# Patient Record
Sex: Female | Born: 1975 | Race: White | Hispanic: No | Smoking: Current every day smoker
Health system: Southern US, Community
[De-identification: ages and names within clinical notes are randomized; demographics above are authoritative.]

## PROBLEM LIST (undated history)

## (undated) DIAGNOSIS — N809 Endometriosis, unspecified: Secondary | ICD-10-CM

## (undated) DIAGNOSIS — K589 Irritable bowel syndrome without diarrhea: Secondary | ICD-10-CM

## (undated) DIAGNOSIS — N898 Other specified noninflammatory disorders of vagina: Principal | ICD-10-CM

## (undated) DIAGNOSIS — F4310 Post-traumatic stress disorder, unspecified: Secondary | ICD-10-CM

## (undated) DIAGNOSIS — Z87442 Personal history of urinary calculi: Secondary | ICD-10-CM

## (undated) DIAGNOSIS — E559 Vitamin D deficiency, unspecified: Principal | ICD-10-CM

## (undated) DIAGNOSIS — F988 Other specified behavioral and emotional disorders with onset usually occurring in childhood and adolescence: Secondary | ICD-10-CM

## (undated) DIAGNOSIS — F419 Anxiety disorder, unspecified: Secondary | ICD-10-CM

## (undated) DIAGNOSIS — R519 Headache, unspecified: Secondary | ICD-10-CM

## (undated) DIAGNOSIS — M549 Dorsalgia, unspecified: Secondary | ICD-10-CM

## (undated) DIAGNOSIS — S3992XA Unspecified injury of lower back, initial encounter: Secondary | ICD-10-CM

## (undated) DIAGNOSIS — F32A Depression, unspecified: Secondary | ICD-10-CM

## (undated) DIAGNOSIS — K219 Gastro-esophageal reflux disease without esophagitis: Secondary | ICD-10-CM

## (undated) DIAGNOSIS — N289 Disorder of kidney and ureter, unspecified: Secondary | ICD-10-CM

## (undated) DIAGNOSIS — R569 Unspecified convulsions: Secondary | ICD-10-CM

## (undated) DIAGNOSIS — I10 Essential (primary) hypertension: Secondary | ICD-10-CM

## (undated) DIAGNOSIS — G479 Sleep disorder, unspecified: Secondary | ICD-10-CM

## (undated) DIAGNOSIS — G589 Mononeuropathy, unspecified: Secondary | ICD-10-CM

## (undated) DIAGNOSIS — R112 Nausea with vomiting, unspecified: Secondary | ICD-10-CM

## (undated) DIAGNOSIS — S199XXA Unspecified injury of neck, initial encounter: Secondary | ICD-10-CM

## (undated) DIAGNOSIS — Z9889 Other specified postprocedural states: Secondary | ICD-10-CM

## (undated) DIAGNOSIS — M25559 Pain in unspecified hip: Secondary | ICD-10-CM

## (undated) DIAGNOSIS — M1990 Unspecified osteoarthritis, unspecified site: Secondary | ICD-10-CM

## (undated) DIAGNOSIS — F329 Major depressive disorder, single episode, unspecified: Secondary | ICD-10-CM

## (undated) DIAGNOSIS — R319 Hematuria, unspecified: Secondary | ICD-10-CM

## (undated) DIAGNOSIS — R51 Headache: Secondary | ICD-10-CM

## (undated) DIAGNOSIS — Z79899 Other long term (current) drug therapy: Secondary | ICD-10-CM

## (undated) HISTORY — DX: Post-traumatic stress disorder, unspecified: F43.10

## (undated) HISTORY — PX: BACK SURGERY: SHX140

## (undated) HISTORY — DX: Personal history of urinary calculi: Z87.442

## (undated) HISTORY — DX: Depression, unspecified: F32.A

## (undated) HISTORY — DX: Sleep disorder, unspecified: G47.9

## (undated) HISTORY — PX: MOUTH SURGERY: SHX715

## (undated) HISTORY — DX: Mononeuropathy, unspecified: G58.9

## (undated) HISTORY — DX: Hematuria, unspecified: R31.9

## (undated) HISTORY — DX: Other specified noninflammatory disorders of vagina: N89.8

## (undated) HISTORY — DX: Unspecified injury of neck, initial encounter: S19.9XXA

## (undated) HISTORY — DX: Pain in unspecified hip: M25.559

## (undated) HISTORY — DX: Unspecified injury of lower back, initial encounter: S39.92XA

## (undated) HISTORY — DX: Headache, unspecified: R51.9

## (undated) HISTORY — PX: CYSTOSCOPY: SUR368

## (undated) HISTORY — DX: Anxiety disorder, unspecified: F41.9

## (undated) HISTORY — DX: Major depressive disorder, single episode, unspecified: F32.9

## (undated) HISTORY — PX: TONSILLECTOMY: SUR1361

## (undated) HISTORY — DX: Headache: R51

## (undated) HISTORY — PX: ABDOMINAL HYSTERECTOMY: SHX81

## (undated) HISTORY — DX: Endometriosis, unspecified: N80.9

## (undated) HISTORY — DX: Irritable bowel syndrome without diarrhea: K58.9

## (undated) HISTORY — PX: ENDOMETRIAL ABLATION: SHX621

## (undated) HISTORY — PX: CHOLECYSTECTOMY: SHX55

## (undated) HISTORY — DX: Other long term (current) drug therapy: Z79.899

## (undated) HISTORY — DX: Essential (primary) hypertension: I10

## (undated) HISTORY — DX: Dorsalgia, unspecified: M54.9

## (undated) HISTORY — DX: Other specified behavioral and emotional disorders with onset usually occurring in childhood and adolescence: F98.8

## (undated) HISTORY — DX: Vitamin D deficiency, unspecified: E55.9

## (undated) HISTORY — DX: Gastro-esophageal reflux disease without esophagitis: K21.9

## (undated) HISTORY — PX: TUBAL LIGATION: SHX77

---

## 2001-05-11 ENCOUNTER — Other Ambulatory Visit: Admission: RE | Admit: 2001-05-11 | Discharge: 2001-05-11 | Payer: Self-pay | Admitting: Obstetrics and Gynecology

## 2004-05-25 ENCOUNTER — Emergency Department (HOSPITAL_COMMUNITY): Admission: EM | Admit: 2004-05-25 | Discharge: 2004-05-25 | Payer: Self-pay | Admitting: Emergency Medicine

## 2004-06-01 ENCOUNTER — Ambulatory Visit (HOSPITAL_COMMUNITY): Admission: RE | Admit: 2004-06-01 | Discharge: 2004-06-01 | Payer: Self-pay | Admitting: Orthopaedic Surgery

## 2004-07-15 ENCOUNTER — Ambulatory Visit (HOSPITAL_COMMUNITY): Admission: RE | Admit: 2004-07-15 | Discharge: 2004-07-16 | Payer: Self-pay | Admitting: Neurosurgery

## 2004-08-17 ENCOUNTER — Encounter (HOSPITAL_COMMUNITY): Admission: RE | Admit: 2004-08-17 | Discharge: 2004-09-16 | Payer: Self-pay | Admitting: Neurosurgery

## 2005-10-27 ENCOUNTER — Ambulatory Visit (HOSPITAL_COMMUNITY): Admission: RE | Admit: 2005-10-27 | Discharge: 2005-10-27 | Payer: Self-pay | Admitting: Obstetrics and Gynecology

## 2005-10-28 ENCOUNTER — Ambulatory Visit (HOSPITAL_COMMUNITY): Admission: RE | Admit: 2005-10-28 | Discharge: 2005-10-28 | Payer: Self-pay | Admitting: General Surgery

## 2005-10-28 ENCOUNTER — Encounter (INDEPENDENT_AMBULATORY_CARE_PROVIDER_SITE_OTHER): Payer: Self-pay | Admitting: General Surgery

## 2006-03-09 ENCOUNTER — Ambulatory Visit (HOSPITAL_COMMUNITY): Admission: RE | Admit: 2006-03-09 | Discharge: 2006-03-09 | Payer: Self-pay | Admitting: Preventative Medicine

## 2006-03-21 ENCOUNTER — Encounter: Admission: RE | Admit: 2006-03-21 | Discharge: 2006-03-21 | Payer: Self-pay | Admitting: Preventative Medicine

## 2006-04-10 ENCOUNTER — Encounter: Admission: RE | Admit: 2006-04-10 | Discharge: 2006-04-10 | Payer: Self-pay | Admitting: Preventative Medicine

## 2007-05-25 ENCOUNTER — Ambulatory Visit (HOSPITAL_COMMUNITY): Admission: RE | Admit: 2007-05-25 | Discharge: 2007-05-25 | Payer: Self-pay | Admitting: Obstetrics & Gynecology

## 2007-10-23 ENCOUNTER — Other Ambulatory Visit: Admission: RE | Admit: 2007-10-23 | Discharge: 2007-10-23 | Payer: Self-pay | Admitting: Obstetrics and Gynecology

## 2007-11-01 ENCOUNTER — Ambulatory Visit (HOSPITAL_COMMUNITY): Admission: RE | Admit: 2007-11-01 | Discharge: 2007-11-01 | Payer: Self-pay | Admitting: Obstetrics and Gynecology

## 2008-08-26 ENCOUNTER — Emergency Department (HOSPITAL_COMMUNITY): Admission: EM | Admit: 2008-08-26 | Discharge: 2008-08-26 | Payer: Self-pay | Admitting: Emergency Medicine

## 2008-10-20 ENCOUNTER — Emergency Department (HOSPITAL_COMMUNITY): Admission: EM | Admit: 2008-10-20 | Discharge: 2008-10-20 | Payer: Self-pay | Admitting: Emergency Medicine

## 2008-11-07 ENCOUNTER — Ambulatory Visit (HOSPITAL_COMMUNITY): Admission: RE | Admit: 2008-11-07 | Discharge: 2008-11-07 | Payer: Self-pay | Admitting: Family Medicine

## 2009-03-25 ENCOUNTER — Other Ambulatory Visit: Admission: RE | Admit: 2009-03-25 | Discharge: 2009-03-25 | Payer: Self-pay | Admitting: Obstetrics and Gynecology

## 2009-06-23 ENCOUNTER — Emergency Department (HOSPITAL_COMMUNITY): Admission: EM | Admit: 2009-06-23 | Discharge: 2009-06-23 | Payer: Self-pay | Admitting: Emergency Medicine

## 2010-04-10 ENCOUNTER — Emergency Department (HOSPITAL_COMMUNITY): Admission: EM | Admit: 2010-04-10 | Discharge: 2010-04-10 | Payer: Self-pay | Admitting: Emergency Medicine

## 2010-11-14 ENCOUNTER — Encounter: Payer: Self-pay | Admitting: Preventative Medicine

## 2010-12-06 ENCOUNTER — Emergency Department (HOSPITAL_COMMUNITY)
Admission: EM | Admit: 2010-12-06 | Discharge: 2010-12-06 | Disposition: A | Discharge status: Home / Self Care | Payer: Medicaid Other | Attending: Emergency Medicine | Admitting: Emergency Medicine

## 2010-12-06 ENCOUNTER — Emergency Department (HOSPITAL_COMMUNITY): Payer: Medicaid Other

## 2010-12-06 DIAGNOSIS — R109 Unspecified abdominal pain: Secondary | ICD-10-CM | POA: Insufficient documentation

## 2010-12-06 DIAGNOSIS — N201 Calculus of ureter: Secondary | ICD-10-CM | POA: Insufficient documentation

## 2010-12-06 DIAGNOSIS — N390 Urinary tract infection, site not specified: Secondary | ICD-10-CM | POA: Insufficient documentation

## 2010-12-06 LAB — URINALYSIS, ROUTINE W REFLEX MICROSCOPIC
Bilirubin Urine: NEGATIVE
Ketones, ur: NEGATIVE mg/dL
Leukocytes, UA: NEGATIVE
Nitrite: POSITIVE — AB
Protein, ur: NEGATIVE mg/dL
Specific Gravity, Urine: 1.025 (ref 1.005–1.030)
Urine Glucose, Fasting: NEGATIVE mg/dL
Urobilinogen, UA: 0.2 mg/dL (ref 0.0–1.0)
pH: 6.5 (ref 5.0–8.0)

## 2010-12-06 LAB — COMPREHENSIVE METABOLIC PANEL WITH GFR
ALT: 11 U/L (ref 0–35)
AST: 19 U/L (ref 0–37)
BUN: 7 mg/dL (ref 6–23)
Calcium: 8.9 mg/dL (ref 8.4–10.5)
GFR calc Af Amer: >60 mL/min (ref >60)
GFR calc non Af Amer: >60 mL/min (ref >60)
Total Bilirubin: 0.3 mg/dL (ref 0.3–1.2)
Total Protein: 6 g/dL (ref 6.0–8.3)

## 2010-12-06 LAB — DIFFERENTIAL
Basophils Absolute: 0 10*3/uL (ref 0.0–0.1)
Basophils Relative: 0 % (ref 0–1)
Eosinophils Absolute: 0.2 K/uL (ref 0.0–0.7)
Eosinophils Relative: 2 % (ref 0–5)
Lymphocytes Relative: 25 % (ref 12–46)
Lymphs Abs: 2.3 K/uL (ref 0.7–4.0)
Monocytes Absolute: 0.7 10*3/uL (ref 0.1–1.0)
Monocytes Relative: 8 % (ref 3–12)
Neutro Abs: 6 10*3/uL (ref 1.7–7.7)
Neutrophils Relative %: 65 % (ref 43–77)

## 2010-12-06 LAB — COMPREHENSIVE METABOLIC PANEL
Albumin: 3.5 g/dL (ref 3.5–5.2)
Alkaline Phosphatase: 47 U/L (ref 39–117)
CO2: 28 mEq/L (ref 19–32)
Chloride: 106 mEq/L (ref 96–112)
Creatinine, Ser: 0.89 mg/dL (ref 0.4–1.2)
Glucose, Bld: 114 mg/dL — ABNORMAL HIGH (ref 70–99)
Potassium: 4.7 mEq/L (ref 3.5–5.1)
Sodium: 138 mEq/L (ref 135–145)

## 2010-12-06 LAB — URINE MICROSCOPIC-ADD ON

## 2010-12-06 LAB — CBC
HCT: 39.4 % (ref 36.0–46.0)
Hemoglobin: 14 g/dL (ref 12.0–15.0)
MCH: 31.3 pg (ref 26.0–34.0)
MCHC: 35.5 g/dL (ref 30.0–36.0)
MCV: 88.1 fL (ref 78.0–100.0)
Platelets: 248 K/uL (ref 150–400)
RBC: 4.47 MIL/uL (ref 3.87–5.11)
RDW: 12.5 % (ref 11.5–15.5)
WBC: 9.2 K/uL (ref 4.0–10.5)

## 2010-12-06 LAB — PREGNANCY, URINE: Preg Test, Ur: NEGATIVE

## 2010-12-06 LAB — POCT PREGNANCY, URINE: Preg Test, Ur: NEGATIVE

## 2010-12-06 LAB — LIPASE, BLOOD: Lipase: 17 U/L (ref 11–59)

## 2010-12-06 MED ORDER — IOHEXOL 300 MG/ML  SOLN
100.0000 mL | Freq: Once | INTRAMUSCULAR | Status: AC | PRN
  Administered 2010-12-06: 100 mL via INTRAVENOUS

## 2010-12-08 LAB — URINE CULTURE
Colony Count: >=100000
Culture  Setup Time: 189905080022

## 2011-01-29 LAB — COMPREHENSIVE METABOLIC PANEL
AST: 18 U/L (ref 0–37)
Alkaline Phosphatase: 47 U/L (ref 39–117)
BUN: 7 mg/dL (ref 6–23)
CO2: 27 mEq/L (ref 19–32)
Chloride: 105 mEq/L (ref 96–112)
Creatinine, Ser: 0.89 mg/dL (ref 0.4–1.2)
GFR calc non Af Amer: >60 mL/min (ref >60)
Potassium: 4 mEq/L (ref 3.5–5.1)
Total Bilirubin: 0.5 mg/dL (ref 0.3–1.2)

## 2011-01-29 LAB — DIFFERENTIAL
Basophils Absolute: 0 10*3/uL (ref 0.0–0.1)
Basophils Relative: 0 % (ref 0–1)
Eosinophils Relative: 1 % (ref 0–5)
Lymphocytes Relative: 22 % (ref 12–46)

## 2011-01-29 LAB — D-DIMER, QUANTITATIVE: D-Dimer, Quant: 0.26 ug/mL-FEU (ref 0.00–0.48)

## 2011-01-29 LAB — CBC
HCT: 40.9 % (ref 36.0–46.0)
MCV: 94.2 fL (ref 78.0–100.0)
RBC: 4.34 MIL/uL (ref 3.87–5.11)
WBC: 9.5 10*3/uL (ref 4.0–10.5)

## 2011-01-29 LAB — POCT CARDIAC MARKERS: CKMB, poc: <1 ng/mL — ABNORMAL LOW (ref 1.0–8.0)

## 2011-03-08 NOTE — Op Note (Signed)
NAME:  Stacey Booth, Stacey Booth                ACCOUNT NO.:  [id number]   MEDICAL RECORD NO.:  [id number]          PATIENT TYPE:  AMB   LOCATION:  DAY                           FACILITY:  [facility]   PHYSICIAN:  Lazaro Arms, M.D.   DATE OF BIRTH:  08-08-1976   DATE OF PROCEDURE:  05/25/2007  DATE OF DISCHARGE:                               OPERATIVE REPORT   PREOPERATIVE DIAGNOSIS:  Multiparous female, desires permanent  sterilization.   POSTOPERATIVE DIAGNOSIS:  Multiparous female, desires permanent  sterilization.   PROCEDURE:  Laparoscopic bilateral tubal ligation.   SURGEON:  Lazaro Arms, M.D.   ANESTHESIA:  General endotracheal.   FINDINGS:  The patient had a normal uterus, tubes and ovaries.  No  evidence of an ectopic she had within the last 8 weeks.   DESCRIPTION OF OPERATION:  The patient was taken to the operating room  and placed in supine position, underwent general endotracheal  anesthesia.  She was placed in dorsal spine position, prepped and draped  in the usual sterile fashion.  A Hulka tenaculum was placed for uterine  manipulation.  An incision was made in the umbilicus.  I was able to put  the Veress needle in without difficulty.  However, her fascia was very  tough and I could not perforate her with the nonbladed trocar.  As a  result, I did an open laparoscopy without difficulty.  The camera was  then placed through the trocar without difficulty.  The pelvic anatomy  was identified and found to be normal.  Both tubes were identified,  burned to no resistance and beyond in the distal isthmic-ampullary  region of both tubes to no resistance and beyond.  There was good  hemostasis.  On both sides about a 3.5-cm segment was ablated.  The  instrument removed, gas allowed to escape, the fascia closed using 0  Vicryl running as a single suture.  A subcuticular closure was done and  Dermabond was placed on the top.  The patient tolerated the procedure  well.  She  experienced no blood loss and was taken to the recovery room  in good, stable condition.  All counts were correct x3.  She received  Ancef prophylactically.      Lazaro Arms, M.D.  Electronically Signed     LHE/MEDQ  D:  05/25/2007  T:  05/25/2007  Job:  [id number]

## 2011-03-08 NOTE — H&P (Signed)
NAME:  Stacey Booth, RYBOLT                ACCOUNT NO.:  [id number]   MEDICAL RECORD NO.:  [id number]          PATIENT TYPE:  AMB   LOCATION:  DAY                           FACILITY:  [facility]   PHYSICIAN:  Tilda Burrow, M.D. DATE OF BIRTH:  July 21, 1976   DATE OF ADMISSION:  11/01/2007  DATE OF DISCHARGE:  LH                              HISTORY & PHYSICAL   ADMITTING DIAGNOSIS:  Menorrhagia, dysmenorrhea, status post tubal  ligation.   HISTORY OF PRESENT ILLNESS:  This 35 year old female, multiparous,  status post tubal ligation in June 2008, is admitted for hysteroscopy,  D&C, and endometrial ablation.  She is gravida 5, para 2, AB 2, ectopic  1, who has been on birth control pills most of her life when she was not  pregnant.  Since tubal ligation performed in summer 2008, she has had  periods which were ridiculously heavy, with nausea and vomiting and  dizziness for the first 2 days of a 5-6 day cycle.  She describes 3 of  the days as unbearable.  We have discussed the option having to go back  on the pills, and the patient would prefer to attempt a surgical  intervention, having heard of endometrial ablation and not wishing to  resume oral contraceptives.  She is currently on Micronor as we suppress  the endometrium due to its heavy bleeding, with plans to withdrawal  Micronor shortly before her endometrial ablation.  She has had review of  the brochures and was given website sources, and has had a vaginal  ultrasound in our office performed which shows a small 8.7 x 4.3 x 4 cm  uterus, which is smooth, anteverted, with no evidence of fibroids.  The  endometrial cavity thickness is 0.6 cm at maximum thickness and is  anteflexed, anteverted.  Ovaries are small, visible, within normal  limits bilaterally.  There is no other adnexal pathology or tenderness.   PAST MEDICAL HISTORY:  Negative.   FAMILY HISTORY:  Negative.   DRUG ALLERGIES:  PENICILLIN.   She is not a candidate for the  Merina IUD due to the history of ectopic  pregnancy.   PHYSICAL EXAMINATION:  VITAL SIGNS:  Weight 173.6, blood pressure  118/70, pulse 80s.  Urinalysis negative.  Hemoglobin 12.6.  HEENT:  Pupils equal, round, reactive.  NECK:  Supple.  CHEST:  Clear to auscultation.  ABDOMEN:  Nontender.  PELVIC:  External genitalia normal.  Uterus anteflexed, nontender,  nonpurulent.  GC and chlamydia culture performed, as well as Pap smear.  Previous Pap in 2007 was ASCUS, without HPV present.   IMPRESSION:  Menorrhagia, dysmenorrhea.   PLAN:  Endometrial ablation November 01, 2007 at [facility].  Laboratory work, including TSH, on October 30, 2007.      Tilda Burrow, M.D.  Electronically Signed     JVF/MEDQ  D:  10/23/2007  T:  10/24/2007  Job:  [id number]

## 2011-03-08 NOTE — Op Note (Signed)
NAME:  Stacey Booth, Stacey Booth                ACCOUNT NO.:  [id number]   MEDICAL RECORD NO.:  [id number]          PATIENT TYPE:  AMB   LOCATION:  DAY                           FACILITY:  [facility]   PHYSICIAN:  Tilda Burrow, M.D. DATE OF BIRTH:  03/08/1976   DATE OF PROCEDURE:  DATE OF DISCHARGE:                               OPERATIVE REPORT   PREOPERATIVE DIAGNOSES:  1. Menorrhagia.  2. Dysmenorrhea.   POSTOPERATIVE DIAGNOSES:  1. Menorrhagia.  2. Dysmenorrhea.   PROCEDURE:  1. Hysteroscopy.  2. Endometrial ablation.   ASSESSMENT:  None.   ANESTHESIA:  General with LMA.   COMPLICATIONS:  None.   FINDINGS:  Uterus sounded to 7.5 cm, well denuded endometrial surfaces,  status post recent menses.   DESCRIPTION OF PROCEDURE:  The patient was taken to the operating room,  prepped and draped for vaginal procedure.  The speculum was inserted.  The cervix was grasped with a single tooth tenaculum.  A paracervical  block using 10 mL of Marcaine with epinephrine injected  circumferentially around the uterus.  The uterus was sounded to 7.5 cm  in the anteflexed position, dilated to 25 [place] followed introduction  of 30 degree hysteroscope, confirming a thoroughly denuded endometrial  cavity without polyps or masses or deformities.  There was no point in a  dilation and curettage, therefore, we proceeded with endometrial  ablation proceeding to test, insert and fill the  Gynecare ThermaChoice 3 endometrial ablation device with 9 mL of D5W  used to achieve 150 to 160 mmHg intra-uterine pressure.  The 8 minute  ablation sequence was completed successfully.  All the fluid retrieved  and accounted for and the patient then allowed to go to the recovery  room in stable condition, well sedated from her medications.      Tilda Burrow, M.D.  Electronically Signed     JVF/MEDQ  D:  11/01/2007  T:  11/01/2007  Job:  [id number]   cc:   [facility]

## 2011-03-11 NOTE — H&P (Signed)
NAME:  Stacey Booth, Stacey Booth                ACCOUNT NO.:  [id number]   MEDICAL RECORD NO.:  [id number]          PATIENT TYPE:  AMB   LOCATION:  DAY                           FACILITY:  [facility]   PHYSICIAN:  Dalia Heading, M.D.  DATE OF BIRTH:  [birth year]   DATE OF ADMISSION:  10/28/2005  DATE OF DISCHARGE:  LH                                HISTORY & PHYSICAL   CHIEF COMPLAINT:  Chronic cholecystitis.   HISTORY OF PRESENT ILLNESS:  Patient is a 35 year old white female who is  referred for evaluation and treatment of biliary colic secondary to chronic  cholecystitis.  She has been having right upper quadrant abdominal pain with  radiation to the right flank, nausea, and bloating for several weeks.  She  does have fatty food intolerance.  No fever, chills, or jaundice have been  noted.   PAST MEDICAL HISTORY:  Back difficulties.   PAST SURGICAL HISTORY:  Back surgery in 2005, tonsillectomy.   CURRENT MEDICATIONS:  Pepto-Bismol p.r.n.   ALLERGIES:  PENICILLIN.   REVIEW OF SYSTEMS:  Patient smokes a pack of cigarettes a day.  She drinks  alcohol only socially.  No other cardiopulmonary difficulties or bleeding  disorders are noted.   PHYSICAL EXAMINATION:  GENERAL:  On physical examination, patient is a well-  developed, well-nourished white female in no acute distress.  VITAL SIGNS:  She is afebrile and vital signs are stable.  HEENT:  No scleral icterus.  LUNGS:  Clear to auscultation with equal breath sounds bilaterally.  HEART:  Regular rate and rhythm without S3, S4, or murmurs.  ABDOMEN:  Soft and nontender.  She is tender in the right upper quadrant to  palpation.  No hepatosplenomegaly, masses, or hernias are identified.  Ultrasound of the gallbladder reveals cholelithiasis with a thickened  gallbladder wall.   IMPRESSION:  Cholecystitis, cholelithiasis.   PLAN:  The patient is scheduled for a laparoscopic cholecystectomy on  October 28, 2005.  The risks and benefits of the  procedure including  bleeding, infection, hepatobiliary injury, the possibility of an open  procedure were fully explained to the patient, who gave informed consent.      Dalia Heading, M.D.  Electronically Signed     MAJ/MEDQ  D:  10/27/2005  T:  10/27/2005  Job:  [id number]   cc:   [facility]  Fax: [phone]   Tilda Burrow, M.D.  Fax: [phone]

## 2011-03-11 NOTE — Op Note (Signed)
NAME:  Stacey Booth, Stacey Booth                ACCOUNT NO.:  [id number]   MEDICAL RECORD NO.:  [id number]          PATIENT TYPE:  AMB   LOCATION:  DAY                           FACILITY:  [facility]   PHYSICIAN:  Dalia Heading, M.D.  DATE OF BIRTH:  02-23-1976   DATE OF PROCEDURE:  10/28/2005  DATE OF DISCHARGE:                                 OPERATIVE REPORT   PREOPERATIVE DIAGNOSIS:  Cholecystitis, cholelithiasis.   POSTOPERATIVE DIAGNOSIS:  Cholecystitis, cholelithiasis.   PROCEDURE:  Laparoscopic cholecystectomy.   SURGEON:  Dr. Franky Macho.   ASSISTANT:  Dr. Arna Snipe.   ANESTHESIA:  General endotracheal.   INDICATIONS:  The patient is a 35 year old white female who presents with  biliary colic secondary to cholelithiasis. This was confirmed by ultrasound.  The common bile duct was within normal limits. The risks and benefits of the  procedure including bleeding, infection, hepatobiliary injury, and the  possibility of an open procedure were fully explained to the patient, who  gave informed consent.   PROCEDURE NOTE:  The patient was placed in the supine position. After  induction of general endotracheal anesthesia, the abdomen was prepped and  draped using the usual sterile technique with Betadine. Surgical site  confirmation was performed.   An infraumbilical incision was made down to the fascia. Veress needle was  introduced into the abdominal cavity, and confirmation of placement was done  using the saline drop test. The abdomen was then insufflated to 16 mmHg  pressure. An 11-mm trocar was introduced into the abdominal cavity under  direct visualization without difficulty. The patient was placed in reversed  Trendelenburg position. An additional 11-mm trocar was placed in the  epigastric region, and a 5-mm trocar was placed in the right upper quadrant  and right flank regions. Liver was inspected and noted to be within normal  limits. The gallbladder was retracted  superiorly and laterally. The  dissection was begun around the infundibulum of the gallbladder. Cystic duct  was first identified. Its juncture to the infundibulum fully identified.  Endoclips were placed proximally distally on the cystic duct, and the cystic  duct was divided. This was likewise done to the cystic artery. The  gallbladder was then freed away from the gallbladder fossa using Bovie  electrocautery. The gallbladder was delivered through the epigastric trocar  site using EndoCatch bag. The gallbladder fossa was inspected. No abnormal  bleeding or bile leakage was noted. Surgicel was placed in the gallbladder  fossa. All fluid and air then evacuate from the abdominal cavity prior to  removal of the trocars.   All wounds were irrigated with normal saline. All wounds were injected with  0.5% Sensorcaine. The infraumbilical fascia as well as epigastric fascia  were reapproximated using 0 Vicryl interrupted sutures. All skin incisions  were closed using staples. Betadine ointment and dry sterile dressings were  applied.   All tape and needle counts were correct at the end of the procedure. The  patient was extubated in the operating room and went back to recovery room  awake in stable condition.   COMPLICATIONS:  None.  SPECIMEN:  Gallbladder.   BLOOD LOSS:  Minimal.      Dalia Heading, M.D.  Electronically Signed     MAJ/MEDQ  D:  10/28/2005  T:  10/28/2005  Job:  [id number]

## 2011-03-11 NOTE — Op Note (Signed)
NAME:  Stacey Booth, Stacey Booth                ACCOUNT NO.:  [id number]   MEDICAL RECORD NO.:  [id number]          PATIENT TYPE:  OIB   LOCATION:  3013                         FACILITY:  [facility]   PHYSICIAN:  Reinaldo Meeker, M.D. DATE OF BIRTH:  12/19/1975   DATE OF PROCEDURE:  07/15/2004  DATE OF DISCHARGE:  07/16/2004                                 OPERATIVE REPORT   PREOPERATIVE DIAGNOSIS:  Herniated disk, L4-5 left.   POSTOPERATIVE DIAGNOSIS:  Herniated disk, L4-5 left.   OPERATION PERFORMED:  Left L4-5 interlaminar laminotomy for excision of  herniated disk with the operating microscope.   SECONDARY PROCEDURE:  Microdissection L4-5 disk and L5 nerve root.   SURGEON:  Reinaldo Meeker, M.D.   ASSISTANT:  Donalee Citrin, M.D.   ANESTHESIA:  General.   DESCRIPTION OF PROCEDURE:  After being placed in the prone position, the  patient's back was prepped and draped in the usual sterile fashion.  Localizing x-ray was taken prior to incision to identify the appropriate  level.  Midline incision was made above the spinous processes of L4 and L5.  Using Bovie cutting current, the incision was carried down to the spinous  processes.  Subperiosteal dissection was then carried out along the spinous  processes and lamina and self-retaining retractor was placed for exposure.  A second x-ray showed approach to the approach to the appropriate level.  Using high speed drill the inferior one third of the L4 lamina and the  medial one third of the facet joint were removed.  Drill was then used to  remove the superior third of the L5 lamina.  Residual bone and ligamentum  were removed in piecemeal fashion.  Microscope was draped and brought into  the field and used for the remainder of the case.  Using microdissection  technique, the lateral aspect of the thecal sac and L5 nerve root were  identified.  Further coagulation was carried down to the floor of the canal  to identify the L4-5 disk which was found  to be markedly herniated.  After  coagulating out the annulus, the annulus was incised with a 15 blade.  Using  pituitary rongeurs and curettes, a very thorough disk space cleanout was  carried out.  At the same time great care was taken to avoid injury to the  neural elements and this was successfully done.  Inspection inferior to the  disk space then showed an inferior fragment which was removed without  difficulty.  At this point inspection was carried out in all directions for  any evidence of residual compression and none could be identified.  Large  amounts of irrigation were carried out any bleeding controlled with  electrocoagulation and Gelfoam.  The wound was then closed using  interrupted Vicryl in the muscle, fascia, subcutaneous and subcuticular  tissues and staples on the skin.  A sterile dressing was applied and the  patient was extubated and taken to recovery room in stable condition.   At this point large amounts of irrigation were carried out and any bleeding  controlled with bipolar coagulation and Gelfoam.  The wound was then closed  using interrupted Vicryl in the muscle, fascia, subcutaneous and  subcuticular tissues and staples on the skin.  A sterile dressing was then  applied and the patient was extubated and taken to the recovery room in  stable condition.       ROK/MEDQ  D:  07/15/2004  T:  07/16/2004  Job:  [id number]

## 2011-03-23 ENCOUNTER — Encounter (HOSPITAL_COMMUNITY): Payer: Self-pay

## 2011-03-23 ENCOUNTER — Other Ambulatory Visit (HOSPITAL_COMMUNITY): Payer: Self-pay | Admitting: Urology

## 2011-03-23 ENCOUNTER — Ambulatory Visit (HOSPITAL_COMMUNITY)
Admission: RE | Admit: 2011-03-23 | Discharge: 2011-03-23 | Disposition: A | Discharge status: Home / Self Care | Payer: Medicaid Other | Source: Ambulatory Visit | Attending: Urology | Admitting: Urology

## 2011-03-23 DIAGNOSIS — R109 Unspecified abdominal pain: Secondary | ICD-10-CM

## 2011-03-23 DIAGNOSIS — R10A1 Flank pain, right side: Secondary | ICD-10-CM

## 2011-03-23 DIAGNOSIS — R1031 Right lower quadrant pain: Secondary | ICD-10-CM | POA: Insufficient documentation

## 2011-03-25 ENCOUNTER — Encounter (HOSPITAL_COMMUNITY): Payer: Medicaid Other

## 2011-03-25 ENCOUNTER — Other Ambulatory Visit: Payer: Self-pay | Admitting: Urology

## 2011-03-25 LAB — CBC
HCT: 40 % (ref 36.0–46.0)
Hemoglobin: 13.7 g/dL (ref 12.0–15.0)
MCH: 31.3 pg (ref 26.0–34.0)
MCHC: 34.3 g/dL (ref 30.0–36.0)
MCV: 91.3 fL (ref 78.0–100.0)
RBC: 4.38 MIL/uL (ref 3.87–5.11)

## 2011-03-25 LAB — BASIC METABOLIC PANEL
BUN: 6 mg/dL (ref 6–23)
CO2: 29 mEq/L (ref 19–32)
Calcium: 10 mg/dL (ref 8.4–10.5)
Chloride: 102 mEq/L (ref 96–112)
Creatinine, Ser: 0.84 mg/dL (ref 0.4–1.2)
GFR calc Af Amer: >60 mL/min (ref >60)
Glucose, Bld: 90 mg/dL (ref 70–99)

## 2011-03-28 ENCOUNTER — Ambulatory Visit (HOSPITAL_COMMUNITY): Payer: Medicaid Other

## 2011-03-28 ENCOUNTER — Ambulatory Visit (HOSPITAL_COMMUNITY)
Admission: RE | Admit: 2011-03-28 | Discharge: 2011-03-28 | Disposition: A | Discharge status: Home / Self Care | Payer: Medicaid Other | Source: Ambulatory Visit | Attending: Urology | Admitting: Urology

## 2011-03-28 DIAGNOSIS — N201 Calculus of ureter: Secondary | ICD-10-CM | POA: Insufficient documentation

## 2011-04-13 NOTE — H&P (Addendum)
NAME:  BRAXTYN, DORFF                ACCOUNT NO.:  [id number]  MEDICAL RECORD NO.:  [id number]           PATIENT TYPE:  O  LOCATION:  DOIB                          FACILITY:  [facility]  PHYSICIAN:  Dennie Maizes, M.D.   DATE OF BIRTH:  1975/12/29  DATE OF ADMISSION:  03/25/2011 DATE OF DISCHARGE:  LH                             HISTORY & PHYSICAL   She is coming to the [facility] on March 28, 2011.  CHIEF COMPLAINT:  Right flank pain, right distal ureteral calculus obstruction.  HISTORY OF PRESENT ILLNESS:  The is a 35 year old female experienced onset of severe right flank pain and dark-colored urine.  About 3 months ago, the patient was evaluated in the emergency room at [facility]  Urinalysis are not suggestive of urinary tract infection. Further evaluation was done with noncontrast CT scan of abdomen and pelvis.  This revealed a 3-mm size right distal ureteral calculus.  The patient was sent home with pain medication and antibiotics.  She felt better after this.  She has not passed a stone.  She has recurrent pain at present.  She denied having voiding difficulty, fever, chills or dysuria.  She was seen in the office.  X-ray of KUB revealed multiple calcific shadows in the region of distal ureter; one of them could represent the stone.  The patient was brought to the [facility] today for cystoscopy, retrograde pyelogram, right ureteroscopy, stone extraction and stent placement.  PAST MEDICAL HISTORY:  History of chronic bronchitis status post ectopic pregnancy status post bilateral tubal ligation, endometrial ablation status post cholecystectomy.  MEDICATIONS:  Percocet 5/325 mg p.o. one p.o. q.8 h. p.r.n. pain.  ALLERGIES:  PENICILLIN.  SOCIAL HISTORY:  The patient does not drink.  She is a smoker.  FAMILY HISTORY:  Positive for diabetes mellitus, heart disease, stroke, thyroid problems, and cancer of unknown site.  REVIEW OF  SYSTEMS:  Positive for low fever, chills, drug allergy to PENICILLIN, excessive thirst, hot and cold feeling, tiredness, sluggishness, abdominal pain, nausea, vomiting, back pain, frequent urination and painful urination.  PHYSICAL EXAMINATION:  GENERAL:  The patient is comfortable at the time of examination. VITAL SIGNS:  Temperature 97.5 degrees Fahrenheit, blood pressure 172/82, height 5 feet 6 inches, weight 155 pounds. HEAD, EYES, EARS, NOSE AND THROAT:  Normal. LUNGS:  Clear to auscultation. HEART:  Regular rate and rhythm.  No murmurs. ABDOMEN:  Soft.  No palpable flank mass.  Mild right costovertebral angle tenderness was noted.  Bladder not palpable.  IMPRESSION:  Right distal ureteral calculi with obstruction, right flank pain.  PLAN:  Cystoscopy, retrograde pyelogram, right ureteroscopy stone extraction and stent placement at [facility].  I have informed the patient regarding diagnosis, operative details, possible treatment outcome, possible risks and complications and she has agreed for the procedure to be done.     Dennie Maizes, M.D.   SK/MEDQ  D:  03/25/2011  T:  03/26/2011  Job:  [id number]  cc:   Melvyn Novas, MD Fax: [phone]  [facility]  Electronically Signed by Dennie Maizes  M.D. on 04/13/2011 10:42:05 AM

## 2011-04-13 NOTE — Op Note (Signed)
  Stacey Booth, Stacey Booth                ACCOUNT NO.:  [id number]  MEDICAL RECORD NO.:  [id number]  LOCATION:  DAYP                          FACILITY:  [facility]  PHYSICIAN:  Dennie Maizes, M.D.   DATE OF BIRTH:  31-Oct-1975  DATE OF PROCEDURE:  03/28/2011 DATE OF DISCHARGE:                              OPERATIVE REPORT   PREOPERATIVE DIAGNOSES:  Right distal ureteral calculus, right flank pain.  POSTOPERATIVE DIAGNOSES:  Right distal ureteral calculus, right flank pain.  OPERATIVE PROCEDURES:  Cystoscopy, retrograde pyelogram, and right ureteroscopy.  ANESTHESIA:  General.  SURGEON:  Dennie Maizes, MD  COMPLICATIONS:  None.  ESTIMATED BLOOD LOSS:  None.  DRAINS:  None.  SPECIMEN:  None.  INDICATIONS FOR PROCEDURE:  This 35 year old female has a history of urolithiasis in the past.  She was having severe intermittent right flank pain.  X-rays revealed a small right distal ureteral calculus. She had persistent pain, but she has not passed any stone in the urine. X-ray of the KUB area revealed multiple calcific densities in the pelvic area, probably representing the stone.  The patient was symptomatic, and thus she was taken to the operating room today for cystoscopy, retrograde pyelogram with possible ureteroscopy, stone extraction, and stent placement.  DESCRIPTION OF PROCEDURE:  General anesthesia was induced and the patient was placed on the OR table in the dorsal lithotomy position. Lower abdomen and genitalia were prepped and draped in a sterile fashion.  Cystoscopy was done with a 24-French scope.  Appearance of bladder was normal.  The trigone, ureteral orifice, and bladder mucosa were unremarkable.  A 5-French wedge catheter was placed in the right ureteral orifice.  About 7 mL of Omnipaque was injected into the right collecting system and retrograde pyelogram was done.  The distal urethra was normal.  All the calcific densities were found to be outside the line of  the ureter.  There is mild dilation of the upper pole collecting system.  A 5-French open-end catheter was then placed in the right ureteral orifice, a 0.03-gauge Bentson guide was then inserted in the right upper collecting system without any difficulty.  With the rigid ureteroscope, I examined the distal 5 cm of ureter and then no stone was noted.  The guidewire and instruments were removed.  It was possible the patient had passed a stone.  She was transferred to the PACU in a satisfactory condition.     Dennie Maizes, M.D.     SK/MEDQ  D:  03/28/2011  T:  03/29/2011  Job:  [id number]  cc:   Melvyn Novas, MD Fax: [phone]  Electronically Signed by Dennie Maizes M.D. on 04/13/2011 10:42:36 AM

## 2011-06-21 ENCOUNTER — Emergency Department (HOSPITAL_COMMUNITY)
Admission: EM | Admit: 2011-06-21 | Discharge: 2011-06-21 | Disposition: A | Discharge status: Home / Self Care | Payer: Medicaid Other | Attending: Emergency Medicine | Admitting: Emergency Medicine

## 2011-06-21 ENCOUNTER — Encounter (HOSPITAL_COMMUNITY): Payer: Self-pay

## 2011-06-21 DIAGNOSIS — I10 Essential (primary) hypertension: Secondary | ICD-10-CM | POA: Insufficient documentation

## 2011-06-21 DIAGNOSIS — L539 Erythematous condition, unspecified: Secondary | ICD-10-CM | POA: Insufficient documentation

## 2011-06-21 DIAGNOSIS — R609 Edema, unspecified: Secondary | ICD-10-CM | POA: Insufficient documentation

## 2011-06-21 DIAGNOSIS — T7840XA Allergy, unspecified, initial encounter: Secondary | ICD-10-CM | POA: Insufficient documentation

## 2011-06-21 MED ORDER — FAMOTIDINE IN NACL 20-0.9 MG/50ML-% IV SOLN
20.0000 mg | Freq: Once | INTRAVENOUS | Status: AC
  Administered 2011-06-21: 20 mg via INTRAVENOUS
  Filled 2011-06-21: qty 50

## 2011-06-21 MED ORDER — DIPHENHYDRAMINE HCL 50 MG/ML IJ SOLN
25.0000 mg | Freq: Once | INTRAMUSCULAR | Status: AC
  Administered 2011-06-21: 25 mg via INTRAVENOUS
  Filled 2011-06-21: qty 1

## 2011-06-21 MED ORDER — PREDNISONE 50 MG PO TABS: 50.0000 mg | ORAL_TABLET | Freq: Every day | ORAL | Status: AC

## 2011-06-21 MED ORDER — METHYLPREDNISOLONE SODIUM SUCC 125 MG IJ SOLR
125.0000 mg | Freq: Once | INTRAMUSCULAR | Status: AC
  Administered 2011-06-21: 125 mg via INTRAVENOUS
  Filled 2011-06-21: qty 2

## 2011-06-21 MED ORDER — FAMOTIDINE 20 MG PO TABS: 20.0000 mg | ORAL_TABLET | Freq: Two times a day (BID) | ORAL | Status: DC

## 2011-06-21 NOTE — ED Notes (Signed)
Awakened to reassess: Pt with decreased redness over body; hands remain bright red and swollen, but reports that itching and burning is decreased.  In no distress.

## 2011-06-21 NOTE — ED Notes (Signed)
Pt states she broke out in a rash yesterday. Hands swelling

## 2011-06-21 NOTE — ED Provider Notes (Signed)
History   Chart scribed for Donnetta Hutching, MD by Enos Fling; the patient was seen in room APA12/APA12; this patient's care was started at 1:35 PM.    CSN: [id number] Arrival date & time: 06/21/2011 11:38 AM  Chief Complaint  Patient presents with  . Rash   HPI Stacey Booth is a 35 y.o. female who presents to the Emergency Department complaining of allergic reaction. Pt reports waking up from hand swelling and redness 9 hours ago, pt noticed redness was present over entire torso, face, and upper legs with a few scattered welts as well as swelling also in face. Pt took benadryl and went back to sleep; she woke up a few hours later, vomited 7x, and felt her throat was tight. Swelling and redness has improved slightly since taking benadryl, welts have resolved. Pt denies sob, lip swelling, or tongue swelling. No cp or ha. Pt has significant h/o environmental and seasonal allergic reactions but unsure of cause of this reaction; possibly d/t insect bites (unfamiliar with the insect) to hands, upper extremities, and face yesterday while outside.    History reviewed. No pertinent past medical history.  History reviewed. No pertinent past surgical history.  No family history on file.  History  Substance Use Topics  . Smoking status: Current Everyday Smoker  . Smokeless tobacco: Not on file  . Alcohol Use: Yes    OB History    Grav Para Term Preterm Abortions TAB SAB Ect Mult Living                 Previous Medications   BISMUTH SUBSALICYLATE (PEPTO BISMOL) 262 MG CHEWABLE TABLET    Chew 524 mg by mouth as needed. vomiting    DIPHENHYDRAMINE (SOMINEX) 25 MG TABLET    Take 25 mg by mouth at bedtime as needed. For allergies    DIPHENHYDRAMINE-ACETAMINOPHEN (TYLENOL PM) 25-500 MG TABS    Take 1 tablet by mouth at bedtime as needed. For sleep    IBUPROFEN (ADVIL,MOTRIN) 200 MG TABLET    Take 200 mg by mouth every 6 (six) hours as needed. For pain      Allergies as of 06/21/2011 - Review  Complete 06/21/2011  Allergen Reaction Noted  . Penicillins  03/23/2011      Review of Systems 10 Systems reviewed and are negative for acute change except as noted in the HPI.  Physical Exam  BP 101/59  Pulse 94  Temp(Src) 97.4 F (36.3 C) (Oral)  Resp 20  Ht 5\' 6"  (1.676 m)  Wt 150 lb (68.04 kg)  BMI 24.21 kg/m2  SpO2 99%  LMP 06/18/2011  Physical Exam  Nursing note and vitals reviewed. Constitutional: She is oriented to person, place, and time. No distress.       Appearance consistent with age of record  HENT:  Head: Normocephalic and atraumatic.  Right Ear: External ear normal.  Left Ear: External ear normal.  Nose: Nose normal.  Mouth/Throat: Oropharynx is clear and moist.  Eyes: Conjunctivae are normal.  Neck: Neck supple.  Cardiovascular: Normal rate and regular rhythm.  Exam reveals no gallop and no friction rub.   No murmur heard. Pulmonary/Chest: Effort normal and breath sounds normal. She has no wheezes. She has no rhonchi. She has no rales. She exhibits no tenderness.       Airway intact  Abdominal: Soft. There is no tenderness.  Musculoskeletal: Normal range of motion.  Neurological: She is alert and oriented to person, place, and time. No sensory deficit.  Skin: No rash noted.       Diffuse erythema to body; edema to hands and face  Psychiatric: She has a normal mood and affect.    ED Course  Procedures  OTHER DATA REVIEWED: Nursing notes and vital signs reviewed.   ED COURSE: IV Benadryl, Pepcid, Solu-Medrol.  MDM: Recheck prior to discharge: Feeling much better, decreased pruritus, decreased rash.  IMPRESSION: No diagnosis found.   PLAN: discharge All results reviewed and discussed with pt, questions answered, pt agreeable with plan.   CONDITION ON DISCHARGE: stable   MEDS GIVEN IN ED: methylPREDNISolone sodium succinate (SOLU-MEDROL) injection 125 mg (125 mg Intravenous Given 06/21/11 1340)  famotidine (PEPCID) IVPB 20 mg (0 mg  Intravenous Stopped 06/21/11 1407)  diphenhydrAMINE (BENADRYL) injection 25 mg (25 mg Intravenous Given 06/21/11 1338)     DISCHARGE MEDICATIONS: New Prescriptions   No medications on file     SCRIBE ATTESTATION: I personally performed the services described in this documentation, which was scribed in my presence. The recorded information has been reviewed and considered. Donnetta Hutching, MD       Donnetta Hutching, MD 06/21/11 [phone]

## 2011-06-21 NOTE — ED Notes (Addendum)
C/o onset of diffuse red rash to extremities, hands, back, abd, chest, neck; c/o swelling to eyes and lips onset 0430 this AM; states she was bitten by "small black and white bugs that look like little mosquitoes" last night; also reports multiple episodes of n/v since onset; states took Benadryl 50mg  at 0500 w/o relief.

## 2011-07-14 LAB — COMPREHENSIVE METABOLIC PANEL
ALT: 14
BUN: 11
CO2: 26
Calcium: 9.9
GFR calc non Af Amer: >60
Glucose, Bld: 101 — ABNORMAL HIGH
Sodium: 140
Total Protein: 6.2

## 2011-07-14 LAB — RAPID URINE DRUG SCREEN, HOSP PERFORMED
Barbiturates: NOT DETECTED
Benzodiazepines: NOT DETECTED
Cocaine: NOT DETECTED
Opiates: NOT DETECTED

## 2011-07-14 LAB — HCG, QUANTITATIVE, PREGNANCY: hCG, Beta Chain, Quant, S: <2

## 2011-07-14 LAB — DRUG SCREEN PANEL (SERUM)
Barbiturate Scrn: NEGATIVE
Benzodiazepine Scrn: NEGATIVE
Cocaine (Metabolite): NEGATIVE
Methadone (Dolophine), Serum: NEGATIVE
Propoxyphene,Serum: NEGATIVE

## 2011-07-14 LAB — CBC
HCT: 42
Hemoglobin: 14.2
MCHC: 33.9
MCV: 92.5
RBC: 4.54
RDW: 13.2

## 2011-07-14 LAB — TSH: TSH: 1.319

## 2011-08-08 LAB — URINE MICROSCOPIC-ADD ON

## 2011-08-08 LAB — URINALYSIS, ROUTINE W REFLEX MICROSCOPIC
Nitrite: NEGATIVE
Specific Gravity, Urine: 1.025
Urobilinogen, UA: 0.2
pH: 6

## 2011-08-08 LAB — CBC
HCT: 41.3
MCHC: 34.3
MCV: 91.7
Platelets: 264
RDW: 13.4

## 2011-08-08 LAB — COMPREHENSIVE METABOLIC PANEL
Albumin: 3.9
BUN: 10
Calcium: 9.3
Chloride: 106
Creatinine, Ser: 0.8
GFR calc Af Amer: >60
Total Bilirubin: 0.7
Total Protein: 6.1

## 2011-08-08 LAB — DIFFERENTIAL
Basophils Absolute: 0
Lymphocytes Relative: 26
Lymphs Abs: 2.3
Monocytes Absolute: 0.6
Neutro Abs: 5.9

## 2011-08-08 LAB — HCG, QUANTITATIVE, PREGNANCY: hCG, Beta Chain, Quant, S: <2

## 2011-10-15 ENCOUNTER — Emergency Department (HOSPITAL_COMMUNITY)
Admission: EM | Admit: 2011-10-15 | Discharge: 2011-10-15 | Disposition: A | Discharge status: Home / Self Care | Payer: Medicaid Other | Attending: Emergency Medicine | Admitting: Emergency Medicine

## 2011-10-15 ENCOUNTER — Emergency Department (HOSPITAL_COMMUNITY): Payer: Medicaid Other

## 2011-10-15 ENCOUNTER — Encounter (HOSPITAL_COMMUNITY): Payer: Self-pay | Admitting: *Deleted

## 2011-10-15 DIAGNOSIS — R350 Frequency of micturition: Secondary | ICD-10-CM | POA: Insufficient documentation

## 2011-10-15 DIAGNOSIS — N949 Unspecified condition associated with female genital organs and menstrual cycle: Secondary | ICD-10-CM | POA: Insufficient documentation

## 2011-10-15 DIAGNOSIS — R3129 Other microscopic hematuria: Secondary | ICD-10-CM | POA: Insufficient documentation

## 2011-10-15 DIAGNOSIS — Z87442 Personal history of urinary calculi: Secondary | ICD-10-CM | POA: Insufficient documentation

## 2011-10-15 DIAGNOSIS — F17200 Nicotine dependence, unspecified, uncomplicated: Secondary | ICD-10-CM | POA: Insufficient documentation

## 2011-10-15 DIAGNOSIS — R102 Pelvic and perineal pain: Secondary | ICD-10-CM

## 2011-10-15 DIAGNOSIS — Z880 Allergy status to penicillin: Secondary | ICD-10-CM | POA: Insufficient documentation

## 2011-10-15 DIAGNOSIS — R109 Unspecified abdominal pain: Secondary | ICD-10-CM | POA: Insufficient documentation

## 2011-10-15 DIAGNOSIS — R110 Nausea: Secondary | ICD-10-CM | POA: Insufficient documentation

## 2011-10-15 HISTORY — DX: Disorder of kidney and ureter, unspecified: N28.9

## 2011-10-15 LAB — URINALYSIS, ROUTINE W REFLEX MICROSCOPIC
Bilirubin Urine: NEGATIVE
Protein, ur: NEGATIVE mg/dL
Urobilinogen, UA: 0.2 mg/dL (ref 0.0–1.0)

## 2011-10-15 LAB — WET PREP, GENITAL
Trich, Wet Prep: NONE SEEN
Yeast Wet Prep HPF POC: NONE SEEN

## 2011-10-15 MED ORDER — ONDANSETRON HCL 4 MG/2ML IJ SOLN
4.0000 mg | Freq: Once | INTRAMUSCULAR | Status: AC
  Administered 2011-10-15: 4 mg via INTRAVENOUS
  Filled 2011-10-15: qty 2

## 2011-10-15 MED ORDER — KETOROLAC TROMETHAMINE 30 MG/ML IJ SOLN
30.0000 mg | Freq: Once | INTRAMUSCULAR | Status: AC
  Administered 2011-10-15: 30 mg via INTRAVENOUS
  Filled 2011-10-15: qty 1

## 2011-10-15 MED ORDER — PROMETHAZINE HCL 25 MG PO TABS: 25.0000 mg | ORAL_TABLET | Freq: Four times a day (QID) | ORAL | Status: DC | PRN

## 2011-10-15 MED ORDER — METRONIDAZOLE 500 MG PO TABS: 500.0000 mg | ORAL_TABLET | Freq: Three times a day (TID) | ORAL | Status: AC

## 2011-10-15 MED ORDER — TRAMADOL-ACETAMINOPHEN 37.5-325 MG PO TABS: ORAL_TABLET | ORAL | Status: AC

## 2011-10-15 MED ORDER — AZITHROMYCIN 250 MG PO TABS
1000.0000 mg | ORAL_TABLET | Freq: Once | ORAL | Status: AC
  Administered 2011-10-15: 1000 mg via ORAL
  Filled 2011-10-15: qty 4

## 2011-10-15 MED ORDER — MORPHINE SULFATE 4 MG/ML IJ SOLN
4.0000 mg | Freq: Once | INTRAMUSCULAR | Status: AC
  Administered 2011-10-15: 4 mg via INTRAVENOUS
  Filled 2011-10-15: qty 1

## 2011-10-15 MED ORDER — DOXYCYCLINE HYCLATE 100 MG PO CAPS: 100.0000 mg | ORAL_CAPSULE | Freq: Two times a day (BID) | ORAL | Status: AC

## 2011-10-15 NOTE — ED Notes (Signed)
Pt c/o lower back pain this am then radiating to left flank this afternoon. States that it feels like someone is stabbing her left lower abdomen when she urinates. Pt also c/o nausea and possible fever this afternoon.

## 2011-10-15 NOTE — ED Provider Notes (Signed)
History     CSN: [id number]  Arrival date & time 10/15/11  1803   First MD Initiated Contact with Patient 10/15/11 1833      Chief Complaint  Patient presents with  . Flank Pain    (Consider location/radiation/quality/duration/timing/severity/associated sxs/prior treatment) HPI  Patient relates when she woke up today she had pain in the left side of her back that has gradually moved into her left side and now she has left lower quadrant stabbing pain. She's had nausea without vomiting she's unsure of fever she states she took Tylenol 2 hours ago and denies known fever or chills. She has frequency without dysuria but she states when she urinates it makes her abdominal pain feel worse. She states she was diagnosed of the kidneys done on the right in February and had a procedure in June because she had not passed the stone.  Patient relates she had uterine ablation done about 5 years ago because of heavy periods. She relates she just finished her period 2 days ago and it was heavier than usual and she was passing clots. She also relates she's separated from her husband because he was cheating on her. She denies any new sexual partners on her part.  PCP Dr. Delbert Harness Urologist Dr. Jerre Simon  Past Medical History  Diagnosis Date  . Renal disorder     Past Surgical History  Procedure Date  . Cystoscopy   . Back surgery   . Cholecystectomy   . Tubal ligation   . Tonsillectomy     History reviewed. No pertinent family history.  History  Substance Use Topics  . Smoking status: Current Everyday Smoker -- 1.0 packs/day    Types: Cigarettes  . Smokeless tobacco: Not on file  . Alcohol Use: Yes   employed  OB History    Grav Para Term Preterm Abortions TAB SAB Ect Mult Living                  Review of Systems  All other systems reviewed and are negative.    Allergies  Penicillins  Home Medications   Current Outpatient Rx  Name Route Sig Dispense Refill  . BISMUTH  SUBSALICYLATE 262 MG PO CHEW Oral Chew 524 mg by mouth as needed. vomiting     . DIPHENHYDRAMINE HCL (SLEEP) 25 MG PO TABS Oral Take 25 mg by mouth at bedtime as needed. For allergies     . DIPHENHYDRAMINE-APAP (SLEEP) 25-500 MG PO TABS Oral Take 1 tablet by mouth at bedtime as needed. For sleep     . IBUPROFEN 200 MG PO TABS Oral Take 200 mg by mouth every 6 (six) hours as needed. For pain       BP 145/84  Pulse 95  Temp(Src) 98.2 F (36.8 C) (Oral)  Resp 18  Ht 5\' 6"  (1.676 m)  Wt 150 lb (68.04 kg)  BMI 24.21 kg/m2  SpO2 100%  LMP 10/05/2011  Vital signs normal  Physical Exam  Nursing note and vitals reviewed. Constitutional: She is oriented to person, place, and time. She appears well-developed and well-nourished. She is cooperative.  Non-toxic appearance. She does not appear ill. No distress.  HENT:  Head: Normocephalic and atraumatic.  Right Ear: External ear normal.  Left Ear: External ear normal.  Nose: Nose normal. No mucosal edema or rhinorrhea.  Mouth/Throat: Oropharynx is clear and moist and mucous membranes are normal. No dental abscesses or uvula swelling.  Eyes: Conjunctivae and EOM are normal. Pupils are equal, round,  and reactive to light.  Neck: Normal range of motion and full passive range of motion without pain. Neck supple.  Cardiovascular: Normal rate, regular rhythm and normal heart sounds.  Exam reveals no gallop and no friction rub.   No murmur heard. Pulmonary/Chest: Effort normal and breath sounds normal. Not tachypneic. No respiratory distress. She has no wheezes. She has no rhonchi. She has no rales. She exhibits no tenderness and no crepitus.  Abdominal: Soft. Normal appearance and bowel sounds are normal. She exhibits no distension. There is tenderness. There is no rebound and no guarding.       Mild tenderness in the left lower quadrant  Genitourinary:       No flank pain  Tenderness to palpation Normal external genitalia, small amount of white  discharge, tender uterus which feels normal size, mildly tender ovaries bilaterally the left being slightly worse than the right without masses.  Musculoskeletal: She exhibits no edema and no tenderness.  Neurological: She is alert and oriented to person, place, and time. She has normal strength. No cranial nerve deficit.  Skin: Skin is warm, dry and intact. No rash noted. No erythema. No pallor.  Psychiatric: She has a normal mood and affect. Her speech is normal and behavior is normal. Thought content normal. Her mood appears not anxious.    ED Course  Procedures (including critical care time)  Patient given IV fluids, IV pain and nausea medicine. She states she's allergic to penicillin and amoxicillin, she does not know she's ever taken a cephalosporin before. She wanted to be treated for STDs after disclosing her husband has been cheating on her. She was given azithromycin  1 g orally.  Results for orders placed during the hospital encounter of 10/15/11  URINALYSIS, ROUTINE W REFLEX MICROSCOPIC      Component Value Range   Color, Urine YELLOW  YELLOW    APPearance CLEAR  CLEAR    Specific Gravity, Urine 1.010  1.005 - 1.030    pH 6.0  5.0 - 8.0    Glucose, UA NEGATIVE  NEGATIVE (mg/dL)   Hgb urine dipstick SMALL (*) NEGATIVE    Bilirubin Urine NEGATIVE  NEGATIVE    Ketones, ur NEGATIVE  NEGATIVE (mg/dL)   Protein, ur NEGATIVE  NEGATIVE (mg/dL)   Urobilinogen, UA 0.2  0.0 - 1.0 (mg/dL)   Nitrite NEGATIVE  NEGATIVE    Leukocytes, UA NEGATIVE  NEGATIVE   URINE MICROSCOPIC-ADD ON      Component Value Range   Squamous Epithelial / LPF FEW (*) RARE    RBC / HPF 3-6  <3 (RBC/hpf)   Bacteria, UA RARE  RARE   PREGNANCY, URINE      Component Value Range   Preg Test, Ur NEGATIVE    RPR      Component Value Range   RPR NON REACTIVE  NON REACTIVE   WET PREP, GENITAL      Component Value Range   Yeast, Wet Prep NONE SEEN  NONE SEEN    Trich, Wet Prep NONE SEEN  NONE SEEN    Clue  Cells, Wet Prep NONE SEEN  NONE SEEN    WBC, Wet Prep HPF POC FEW (*) NONE SEEN     Laboratory interpretation all normal except for microscopic hematuria  Ct Abdomen Pelvis Wo Contrast  10/15/2011  *RADIOLOGY REPORT*  Clinical Data: Left flank pain, prior history of stones with extraction, question left ureteral calculus  CT ABDOMEN AND PELVIS WITHOUT CONTRAST  Technique:  Multidetector CT  imaging of the abdomen and pelvis was performed following the standard protocol without intravenous contrast. Sagittal and coronal MPR images reconstructed from axial data set.  Comparison: 12/06/2010  Findings: Lung bases clear. Gallbladder surgically absent. No urinary tract calcification or dilatation. Bladder well distended and unremarkable. Within limits of a nonenhanced exam, no focal abnormalities of the liver, spleen, pancreas, kidneys, or adrenal glands identified. Stomach and bowel loops unremarkable for technique.  Appendix not visualized. Normal appearing uterus and adnexae. Numerous pelvic phleboliths. No mass, adenopathy, free fluid or inflammatory process. Advanced degenerative disc disease changes L4-L5.  IMPRESSION: No acute intra abdominal or intrapelvic abnormalities. Specifically, no left side urinary tract calcification or dilatation identified.  Original Report Authenticated By: Lollie Marrow, M.D.       1. Female pelvic pain     New Prescriptions   DOXYCYCLINE (VIBRAMYCIN) 100 MG CAPSULE    Take 1 capsule (100 mg total) by mouth 2 (two) times daily.   METRONIDAZOLE (FLAGYL) 500 MG TABLET    Take 1 tablet (500 mg total) by mouth 3 (three) times daily.   PROMETHAZINE (PHENERGAN) 25 MG TABLET    Take 1 tablet (25 mg total) by mouth every 6 (six) hours as needed for nausea.   TRAMADOL-ACETAMINOPHEN (ULTRACET) 37.5-325 MG PER TABLET    2 tabs po QID prn pain      Plan discharge  Devoria Albe, MD, Armando Gang   MDM          Ward Givens, MD 10/16/11 2300

## 2011-10-16 LAB — RPR: RPR Ser Ql: NONREACTIVE

## 2011-10-17 LAB — GC/CHLAMYDIA PROBE AMP, GENITAL
Chlamydia, DNA Probe: NEGATIVE
GC Probe Amp, Genital: NEGATIVE

## 2011-11-15 ENCOUNTER — Other Ambulatory Visit: Payer: Self-pay | Admitting: Obstetrics & Gynecology

## 2011-11-24 ENCOUNTER — Encounter (HOSPITAL_COMMUNITY)
Admission: RE | Admit: 2011-11-24 | Discharge: 2011-11-24 | Disposition: A | Discharge status: Home / Self Care | Payer: Medicaid Other | Source: Ambulatory Visit | Attending: Obstetrics & Gynecology | Admitting: Obstetrics & Gynecology

## 2011-11-24 ENCOUNTER — Encounter (HOSPITAL_COMMUNITY): Payer: Self-pay

## 2011-11-24 ENCOUNTER — Encounter (HOSPITAL_COMMUNITY): Payer: Self-pay | Admitting: Pharmacy Technician

## 2011-11-24 HISTORY — DX: Unspecified osteoarthritis, unspecified site: M19.90

## 2011-11-24 HISTORY — DX: Other specified postprocedural states: Z98.890

## 2011-11-24 HISTORY — DX: Nausea with vomiting, unspecified: R11.2

## 2011-11-24 LAB — COMPREHENSIVE METABOLIC PANEL
ALT: 11 U/L (ref 0–35)
CO2: 22 mEq/L (ref 19–32)
Calcium: 10.2 mg/dL (ref 8.4–10.5)
Creatinine, Ser: 0.8 mg/dL (ref 0.50–1.10)
GFR calc Af Amer: >90 mL/min (ref >90)
GFR calc non Af Amer: >90 mL/min (ref >90)
Glucose, Bld: 70 mg/dL (ref 70–99)

## 2011-11-24 LAB — URINALYSIS, ROUTINE W REFLEX MICROSCOPIC
Bilirubin Urine: NEGATIVE
Hgb urine dipstick: NEGATIVE
Specific Gravity, Urine: 1.005 (ref 1.005–1.030)
Urobilinogen, UA: 0.2 mg/dL (ref 0.0–1.0)

## 2011-11-24 LAB — CBC
HCT: 43 % (ref 36.0–46.0)
Hemoglobin: 15 g/dL (ref 12.0–15.0)
MCH: 32 pg (ref 26.0–34.0)
MCV: 91.7 fL (ref 78.0–100.0)
RBC: 4.69 MIL/uL (ref 3.87–5.11)

## 2011-11-24 LAB — SURGICAL PCR SCREEN: MRSA, PCR: NEGATIVE

## 2011-11-24 NOTE — Patient Instructions (Addendum)
20 Stacey Booth  11/24/2011   Your procedure is scheduled on:  11/30/11  Report to [facility] at 06:15 AM.  Call this number if you have problems the morning of surgery: [phone]   Remember:   Do not eat food:After Midnight.  May have clear liquids:until Midnight .  Clear liquids include soda, tea, black coffee, apple or grape juice, broth.  Take these medicines the morning of surgery with A SIP OF WATER: none   Do not wear jewelry, make-up or nail polish.  Do not wear lotions, powders, or perfumes. You may wear deodorant.  Do not shave 48 hours prior to surgery.  Do not bring valuables to the hospital.  Contacts, dentures or bridgework may not be worn into surgery.  Leave suitcase in the car. After surgery it may be brought to your room.  For patients admitted to the hospital, checkout time is 11:00 AM the day of discharge.   Patients discharged the day of surgery will not be allowed to drive home.  Name and phone number of your driver:   Special Instructions: CHG Shower Use Special Wash: 1/2 bottle night before surgery and 1/2 bottle morning of surgery.   Please read over the following fact sheets that you were given: Pain Booklet, MRSA Information, Surgical Site Infection Prevention, Anesthesia Post-op Instructions and Care and Recovery After Surgery    Hysterectomy A hysterectomy is a procedure where your womb (uterus) is surgically taken out. It will no longer be possible to have menstrual periods or to become pregnant. Removal of the tubes and ovaries (bilateral salpingo-oopherectomy) can be done during this operation as well.   An abdominal hysterectomy is done through a large cut (incision) in the abdomen made by the surgeon.   A vaginal hysterectomy is done through the vagina. There are no abdominal incisions, but there will be incisions on the inside of the vagina.   A laparoscopic assisted vaginal hysterectomy is done through 2 or 3 small incisions in the abdomen, but  the uterus is removed and passed through the vagina.  Women who are going to have a hysterectomy should be tested first to make sure there is no cancer of the cervix or in the uterus. INDICATIONS FOR HYSTERECTOMY:  Persistent abnormal bleeding.   Lasting (chronic) pelvic pain.   Endometriosis. This is when the lining of the uterus (endometrium) is misplaced outside of its normal location.   Adenomyosis. This is when the endometrium tissue grows in the muscle of the uterus.   Uterine prolapse. This is when the uterus falls down into the vagina.   Cancer of the uterus or cervix that requires a radical hysterectomy, removal of the uterus, tubes, ovaries, and surrounding lymph nodes.  LET YOUR CAREGIVER KNOW ABOUT:  Allergies (especially to medicines).   Medications taken including herbs, eye drops, over the counter medications, and creams.   Use of steroids (by mouth or creams).   Past problems with anesthetics or numbing medication.   Possibility of pregnancy, if this applies.   History of blood clots (thrombophlebitis).   History of bleeding or blood problems.   Past surgery.   Other health problems.  RISKS AND COMPLICATIONS All surgeries can have risks. Some of these risks are:  A lot of bleeding.   Injury to surrounding organs.   Infection.   Blood clots of the leg, heart, or lung.   Problems with anesthesia.   Early menopause.  BEFORE THE PROCEDURE  Do not take aspirin or blood  thinners for a week before surgery, or as directed by your caregiver.   Do not eat or drink anything after midnight the night before surgery, or as directed by your caregiver.   Let your caregiver know if you get a cold or other infectious problems before surgery.   If you are being admitted the day of surgery, you should be present 60 minutes before your procedure or as told by your caregiver.  PROCEDURE   An IV (intravenous) will be placed in your arm. You will be given a drug to  make you sleep (anesthetic) during surgery. You may be given a shot in the spine (spinal anesthesia) that will numb your body from the waist down. This will keep you pain-free during surgery.   When you wake from surgery, you will have the IV and a long, narrow, hollow tube (urinary catheter) draining the bladder for 1 or 2 days after surgery. This will make passing your urine easier. It also helps by keeping your bladder empty during surgery.   After surgery, you will be taken to the recovery area where a nurse will watch and check your progress. Once you wake up, stable and taking fluids well, without other problems, you will be allowed to return to your room. Usually you will remain in the hospital 3 to 5 days. You may be given an antibiotic during and after the surgery and when you go home. Pain medication will be ordered by your caregiver while you are in the hospital and when you go home.  HOME CARE INSTRUCTIONS  Healing will take time. You will have discomfort, tenderness, swelling, and bruising at the operative site for a couple of weeks. This is normal and will get better as time goes on.   Only take over-the-counter or prescription medicines for pain, discomfort, or fever as directed by your caregiver.   Do not take aspirin. It can cause bleeding.   Do not drive when taking pain medication.   Follow your caregiver's advice regarding diet, exercise, lifting, driving, and general activities.   Resume your usual diet as directed and allowed.   Get plenty of rest and sleep.   Do not douche, use tampons, or have sexual intercourse until your caregiver gives you permission.   Change your bandages (dressings) as directed.   Take your temperature twice a day. Write it down.   Your caregiver may recommend showers instead of baths for a few weeks.   Do not drink alcohol until your caregiver gives you permission.   If you develop constipation, you may take a mild laxative with your  caregiver's permission. Bran foods and drinking fluids helps with constipation problems.   Try to have someone home with you for a week or two to help with the household activities.   Make sure you and your family understands everything about your operation and recovery.   Do not sign any legal documents until you feel normal again.   Keep all your follow-up appointments as recommended by your caregiver.  SEEK MEDICAL CARE IF:   There is swelling, redness, or increasing pain in the wound area.   Pus is coming from the wound.   You notice a bad smell from the wound or surgical dressing.   You have pain, redness, and swelling from the intravenous site.   The wound is breaking open (the edges are not staying together).   You feel dizzy or feel like fainting.   You develop pain or bleeding when you  urinate.   You develop diarrhea.   You develop nausea and vomiting.   You develop abnormal vaginal discharge.   You develop a rash.   You have any type of abnormal reaction or develop an allergy to your medication.   You need stronger pain medication for your pain.  SEEK IMMEDIATE MEDICAL CARE IF:  You have a fever.   You develop abdominal pain.   You develop chest pain.   You develop shortness of breath.   You pass out.   You develop pain, swelling, or redness of your leg.   You develop heavy vaginal bleeding with or without blood clots.  Document Released: 04/05/2001 Document Revised: 06/22/2011 Document Reviewed: 02/21/2008 [facility] Patient Information 2012 [place], [place].

## 2011-11-24 NOTE — Pre-Procedure Instructions (Signed)
Pt viewed video 905. 

## 2011-11-26 LAB — TYPE AND SCREEN: Antibody Screen: NEGATIVE

## 2011-11-29 NOTE — H&P (Signed)
Stacey Booth is an 36 y.o. female. [id number] status ablation in 2009 who had initially good results but the heavy painful bleeding has returned.  Also she is having intermittent RLQ pain with pain with intercourse both on the right side and in the midline.  On exam she has very tender RLQ and with the uterus, which I think is adenomyosis.  As a result we will proceed with a TVH with RSO for definitive management.    Past Medical History  Diagnosis Date  . PONV (postoperative nausea and vomiting)   . Renal disorder     kidney stones  . Arthritis     Past Surgical History  Procedure Date  . Cystoscopy 5-6 months ago    [place]  . Cholecystectomy   . Tubal ligation   . Tonsillectomy   . Back surgery     ruptured disc-laparoscopic repair    Family History  Problem Relation Age of Onset  . Anesthesia problems Neg Hx   . Hypotension Neg Hx   . Malignant hyperthermia Neg Hx   . Pseudochol deficiency Neg Hx     Social History:  reports that she has been smoking Cigarettes.  She has a 3.75 pack-year smoking history. She does not have any smokeless tobacco history on file. She reports that she drinks alcohol. She reports that she does not use illicit drugs.  Allergies:  Allergies  Allergen Reactions  . Penicillins Hives      ROS  Review of Systems  Constitutional: Negative for fever, chills, weight loss, malaise/fatigue and diaphoresis.  HENT: Negative for hearing loss, ear pain, nosebleeds, congestion, sore throat, neck pain, tinnitus and ear discharge.   Eyes: Negative for blurred vision, double vision, photophobia, pain, discharge and redness.  Respiratory: Negative for cough, hemoptysis, sputum production, shortness of breath, wheezing and stridor.   Cardiovascular: Negative for chest pain, palpitations, orthopnea, claudication, leg swelling and PND.  Gastrointestinal: Positive for abdominal pain. Negative for heartburn, nausea, vomiting, diarrhea, constipation, blood in  stool and melena.  Gynecology: positive for heavy painful periods and dyspareunia in midline and in the right adnexa, patient does not have any LLQ pain at all Genitourinary: Negative for dysuria, urgency, frequency, hematuria and flank pain.  Musculoskeletal: Negative for myalgias, back pain, joint pain and falls.  Skin: Negative for itching and rash.  Neurological: Negative for dizziness, tingling, tremors, sensory change, speech change, focal weakness, seizures, loss of consciousness, weakness and headaches.  Endo/Heme/Allergies: Negative for environmental allergies and polydipsia. Does not bruise/bleed easily.  Psychiatric/Behavioral: Negative for depression, suicidal ideas, hallucinations, memory loss and substance abuse. The patient is not nervous/anxious and does not have insomnia.       Physical Exam Physical Exam  Vitals reviewed. Constitutional: She is oriented to person, place, and time. She appears well-developed and well-nourished.  HENT:  Head: Normocephalic and atraumatic.  Right Ear: External ear normal.  Left Ear: External ear normal.  Nose: Nose normal.  Mouth/Throat: Oropharynx is clear and moist.  Eyes: Conjunctivae and EOM are normal. Pupils are equal, round, and reactive to light. Right eye exhibits no discharge. Left eye exhibits no discharge. No scleral icterus.  Neck: Normal range of motion. Neck supple. No tracheal deviation present. No thyromegaly present.  Cardiovascular: Normal rate, regular rhythm, normal heart sounds and intact distal pulses.  Exam reveals no gallop and no friction rub.   No murmur heard. Respiratory: Effort normal and breath sounds normal. No respiratory distress. She has no wheezes. She has no rales.  She exhibits no tenderness.  GI: Soft. Bowel sounds are normal. She exhibits no distension and no mass. There is tenderness in the RLQ. There is no rebound and no guarding.  Genitourinary:       Vulva is normal without lesion Vagina is pink  moist without discharge Cervix normal in appearance and pap is normal Uterus is normal sized mobile and tender probably adenomyosis Adnexa is negative with normal sized ovaries by sonogram, the left ovary however is quite tender but mobile Musculoskeletal: Normal range of motion. She exhibits no edema and no tenderness.  Neurological: She is alert and oriented to person, place, and time. She has normal reflexes. She displays normal reflexes. No cranial nerve deficit. She exhibits normal muscle tone. Coordination normal.  Skin: Skin is warm and dry. No rash noted. No erythema. No pallor.  Psychiatric: She has a normal mood and affect. Her behavior is normal. Judgment and thought content normal.      Recent Results (from the past 336 hour(s))  URINALYSIS, ROUTINE W REFLEX MICROSCOPIC   Collection Time   11/24/11 10:26 AM      Component Value Range   Color, Urine YELLOW  YELLOW    APPearance CLEAR  CLEAR    Specific Gravity, Urine 1.005  1.005 - 1.030    pH 5.5  5.0 - 8.0    Glucose, UA NEGATIVE  NEGATIVE (mg/dL)   Hgb urine dipstick NEGATIVE  NEGATIVE    Bilirubin Urine NEGATIVE  NEGATIVE    Ketones, ur NEGATIVE  NEGATIVE (mg/dL)   Protein, ur NEGATIVE  NEGATIVE (mg/dL)   Urobilinogen, UA 0.2  0.0 - 1.0 (mg/dL)   Nitrite NEGATIVE  NEGATIVE    Leukocytes, UA NEGATIVE  NEGATIVE   SURGICAL PCR SCREEN   Collection Time   11/24/11 10:30 AM      Component Value Range   MRSA, PCR NEGATIVE  NEGATIVE    Staphylococcus aureus NEGATIVE  NEGATIVE   CBC   Collection Time   11/24/11 11:00 AM      Component Value Range   WBC 8.2  4.0 - 10.5 (K/uL)   RBC 4.69  3.87 - 5.11 (MIL/uL)   Hemoglobin 15.0  12.0 - 15.0 (g/dL)   HCT 16.1  09.6 - 04.5 (%)   MCV 91.7  78.0 - 100.0 (fL)   MCH 32.0  26.0 - 34.0 (pg)   MCHC 34.9  30.0 - 36.0 (g/dL)   RDW 40.9  81.1 - 91.4 (%)   Platelets 275  150 - 400 (K/uL)  COMPREHENSIVE METABOLIC PANEL   Collection Time   11/24/11 11:00 AM      Component Value  Range   Sodium 138  135 - 145 (mEq/L)   Potassium 4.4  3.5 - 5.1 (mEq/L)   Chloride 106  96 - 112 (mEq/L)   CO2 22  19 - 32 (mEq/L)   Glucose, Bld 70  70 - 99 (mg/dL)   BUN 9  6 - 23 (mg/dL)   Creatinine, Ser 7.82  0.50 - 1.10 (mg/dL)   Calcium 95.6  8.4 - 10.5 (mg/dL)   Total Protein 7.4  6.0 - 8.3 (g/dL)   Albumin 4.3  3.5 - 5.2 (g/dL)   AST 14  0 - 37 (U/L)   ALT 11  0 - 35 (U/L)   Alkaline Phosphatase 59  39 - 117 (U/L)   Total Bilirubin 0.2 (*) 0.3 - 1.2 (mg/dL)   GFR calc non Af Amer >90  >90 (mL/min)  GFR calc Af Amer >90  >90 (mL/min)  HCG, QUANTITATIVE, PREGNANCY   Collection Time   11/24/11 11:00 AM      Component Value Range   hCG, Beta Chain, Quant, S <1  <5 (mIU/mL)  TYPE AND SCREEN   Collection Time   11/24/11 11:00 AM      Component Value Range   ABO/RH(D) O POS     Antibody Screen NEG     Sample Expiration 12/08/2011       Assessment/Plan: 1.  Menometrorrhagia and dysmenorrhea status post ablation 2009 2.  RLQ pian and dyspareunia on the right  Will proceed with TVH and RSO.  Pt understands the risks of surgery including but not limited t  excessive bleeding requiring transfusion or reoperation, post-operative infection requiring prolonged hospitalization or re-hospitalization and antibiotic therapy, and damage to other organs including bladder, bowel, ureters and major vessels.  The patient also understands the alternative treatment options which were discussed in full.  All questions were answered.    Corbitt Cloke H 11/29/2011, 10:49 PM

## 2011-11-30 ENCOUNTER — Encounter (HOSPITAL_COMMUNITY): Payer: Self-pay | Admitting: *Deleted

## 2011-11-30 ENCOUNTER — Encounter (HOSPITAL_COMMUNITY)
Admission: RE | Disposition: A | Discharge status: Home / Self Care | Payer: Self-pay | Source: Ambulatory Visit | Attending: Obstetrics & Gynecology

## 2011-11-30 ENCOUNTER — Ambulatory Visit (HOSPITAL_COMMUNITY)
Admission: RE | Admit: 2011-11-30 | Discharge: 2011-11-30 | Disposition: A | Discharge status: Home / Self Care | Payer: Medicaid Other | Source: Ambulatory Visit | Attending: Obstetrics & Gynecology | Admitting: Obstetrics & Gynecology

## 2011-11-30 ENCOUNTER — Encounter (HOSPITAL_COMMUNITY): Payer: Self-pay | Admitting: Anesthesiology

## 2011-11-30 ENCOUNTER — Other Ambulatory Visit: Payer: Self-pay | Admitting: Obstetrics & Gynecology

## 2011-11-30 DIAGNOSIS — Z90710 Acquired absence of both cervix and uterus: Secondary | ICD-10-CM

## 2011-11-30 DIAGNOSIS — Z01812 Encounter for preprocedural laboratory examination: Secondary | ICD-10-CM | POA: Insufficient documentation

## 2011-11-30 DIAGNOSIS — N920 Excessive and frequent menstruation with regular cycle: Secondary | ICD-10-CM | POA: Insufficient documentation

## 2011-11-30 DIAGNOSIS — N946 Dysmenorrhea, unspecified: Secondary | ICD-10-CM | POA: Insufficient documentation

## 2011-11-30 DIAGNOSIS — R1031 Right lower quadrant pain: Secondary | ICD-10-CM | POA: Insufficient documentation

## 2011-11-30 HISTORY — PX: VAGINAL HYSTERECTOMY: SHX2639

## 2011-11-30 HISTORY — PX: SALPINGOOPHORECTOMY: SHX82

## 2011-11-30 SURGERY — HYSTERECTOMY, VAGINAL
Anesthesia: General | Site: Uterus | Laterality: Right | Wound class: Clean Contaminated

## 2011-11-30 MED ORDER — CIPROFLOXACIN IN D5W 400 MG/200ML IV SOLN: 400.0000 mg | INTRAVENOUS | Status: DC

## 2011-11-30 MED ORDER — KETOROLAC TROMETHAMINE 10 MG PO TABS: 10.0000 mg | ORAL_TABLET | Freq: Three times a day (TID) | ORAL | Status: AC | PRN

## 2011-11-30 MED ORDER — CLINDAMYCIN PHOSPHATE 900 MG/50ML IV SOLN
INTRAVENOUS | Status: AC
  Administered 2011-11-30: 900 mg via INTRAVENOUS
  Filled 2011-11-30: qty 50

## 2011-11-30 MED ORDER — DEXAMETHASONE SODIUM PHOSPHATE 4 MG/ML IJ SOLN
4.0000 mg | Freq: Once | INTRAMUSCULAR | Status: AC
  Administered 2011-11-30: 4 mg via INTRAVENOUS

## 2011-11-30 MED ORDER — FENTANYL CITRATE 0.05 MG/ML IJ SOLN
INTRAMUSCULAR | Status: DC | PRN
  Administered 2011-11-30 (×2): 50 ug via INTRAVENOUS
  Administered 2011-11-30: 100 ug via INTRAVENOUS
  Administered 2011-11-30 (×2): 50 ug via INTRAVENOUS

## 2011-11-30 MED ORDER — ONDANSETRON HCL 4 MG/2ML IJ SOLN: 4.0000 mg | Freq: Once | INTRAMUSCULAR | Status: DC | PRN

## 2011-11-30 MED ORDER — ROCURONIUM BROMIDE 100 MG/10ML IV SOLN
INTRAVENOUS | Status: DC | PRN
  Administered 2011-11-30: 5 mg via INTRAVENOUS
  Administered 2011-11-30: 35 mg via INTRAVENOUS

## 2011-11-30 MED ORDER — FENTANYL CITRATE 0.05 MG/ML IJ SOLN
INTRAMUSCULAR | Status: AC
  Filled 2011-11-30: qty 5

## 2011-11-30 MED ORDER — CLINDAMYCIN PHOSPHATE 900 MG/50ML IV SOLN
900.0000 mg | INTRAVENOUS | Status: AC
  Administered 2011-11-30: 900 mg via INTRAVENOUS

## 2011-11-30 MED ORDER — NEOSTIGMINE METHYLSULFATE 1 MG/ML IJ SOLN
INTRAMUSCULAR | Status: AC
  Filled 2011-11-30: qty 10

## 2011-11-30 MED ORDER — IBUPROFEN 800 MG PO TABS
800.0000 mg | ORAL_TABLET | Freq: Three times a day (TID) | ORAL | Status: DC | PRN
  Administered 2011-11-30: 800 mg via ORAL
  Filled 2011-11-30 (×2): qty 1

## 2011-11-30 MED ORDER — KETOROLAC TROMETHAMINE 30 MG/ML IJ SOLN
INTRAMUSCULAR | Status: DC | PRN
  Administered 2011-11-30: 30 mg via INTRAVENOUS

## 2011-11-30 MED ORDER — SODIUM CHLORIDE 0.9 % IV SOLN
8.0000 mg | Freq: Four times a day (QID) | INTRAVENOUS | Status: DC | PRN
  Filled 2011-11-30: qty 4

## 2011-11-30 MED ORDER — OXYCODONE-ACETAMINOPHEN 5-325 MG PO TABS: 1.0000 | ORAL_TABLET | ORAL | Status: AC | PRN

## 2011-11-30 MED ORDER — OXYCODONE-ACETAMINOPHEN 5-325 MG PO TABS
1.0000 | ORAL_TABLET | ORAL | Status: DC | PRN
  Administered 2011-11-30: 1 via ORAL
  Filled 2011-11-30: qty 1

## 2011-11-30 MED ORDER — GLYCOPYRROLATE 0.2 MG/ML IJ SOLN
INTRAMUSCULAR | Status: DC | PRN
  Administered 2011-11-30: .4 mg via INTRAVENOUS

## 2011-11-30 MED ORDER — CIPROFLOXACIN IN D5W 400 MG/200ML IV SOLN
INTRAVENOUS | Status: AC
  Administered 2011-11-30: 400 mg via INTRAVENOUS
  Filled 2011-11-30: qty 200

## 2011-11-30 MED ORDER — LACTATED RINGERS IV SOLN
INTRAVENOUS | Status: DC
Start: 2011-11-30 — End: 2011-11-30
  Administered 2011-11-30: 1000 mL via INTRAVENOUS

## 2011-11-30 MED ORDER — GLYCOPYRROLATE 0.2 MG/ML IJ SOLN
INTRAMUSCULAR | Status: AC
  Filled 2011-11-30: qty 2

## 2011-11-30 MED ORDER — SCOPOLAMINE 1 MG/3DAYS TD PT72
1.0000 | MEDICATED_PATCH | Freq: Once | TRANSDERMAL | Status: DC
  Administered 2011-11-30: 1.5 mg via TRANSDERMAL

## 2011-11-30 MED ORDER — SODIUM CHLORIDE 0.9 % IR SOLN
Status: DC | PRN
  Administered 2011-11-30: 1000 mL

## 2011-11-30 MED ORDER — MIDAZOLAM HCL 2 MG/2ML IJ SOLN
INTRAMUSCULAR | Status: AC
  Administered 2011-11-30: 2 mg via INTRAVENOUS
  Filled 2011-11-30: qty 2

## 2011-11-30 MED ORDER — LACTATED RINGERS IV SOLN
INTRAVENOUS | Status: DC | PRN
  Administered 2011-11-30 (×2): via INTRAVENOUS

## 2011-11-30 MED ORDER — ONDANSETRON HCL 4 MG/2ML IJ SOLN
INTRAMUSCULAR | Status: DC | PRN
  Administered 2011-11-30: 4 mg via INTRAVENOUS

## 2011-11-30 MED ORDER — PROPOFOL 10 MG/ML IV EMUL
INTRAVENOUS | Status: DC | PRN
  Administered 2011-11-30: 200 mg via INTRAVENOUS

## 2011-11-30 MED ORDER — FENTANYL CITRATE 0.05 MG/ML IJ SOLN
INTRAMUSCULAR | Status: AC
  Administered 2011-11-30: 50 ug via INTRAVENOUS
  Filled 2011-11-30: qty 2

## 2011-11-30 MED ORDER — KETOROLAC TROMETHAMINE 30 MG/ML IJ SOLN
INTRAMUSCULAR | Status: AC
  Filled 2011-11-30: qty 1

## 2011-11-30 MED ORDER — ONDANSETRON HCL 4 MG/2ML IJ SOLN
4.0000 mg | Freq: Once | INTRAMUSCULAR | Status: AC
  Administered 2011-11-30: 4 mg via INTRAVENOUS

## 2011-11-30 MED ORDER — ONDANSETRON HCL 8 MG PO TABS: 8.0000 mg | ORAL_TABLET | Freq: Four times a day (QID) | ORAL | Status: AC | PRN

## 2011-11-30 MED ORDER — NEOSTIGMINE METHYLSULFATE 1 MG/ML IJ SOLN
INTRAMUSCULAR | Status: DC | PRN
  Administered 2011-11-30: 3 mg via INTRAVENOUS

## 2011-11-30 MED ORDER — HYDROMORPHONE HCL PF 1 MG/ML IJ SOLN: 1.0000 mg | INTRAMUSCULAR | Status: DC | PRN

## 2011-11-30 MED ORDER — GLYCOPYRROLATE 0.2 MG/ML IJ SOLN
0.2000 mg | Freq: Once | INTRAMUSCULAR | Status: AC
  Administered 2011-11-30: 0.2 mg via INTRAVENOUS

## 2011-11-30 MED ORDER — BUPIVACAINE-EPINEPHRINE 0.5% -1:200000 IJ SOLN
INTRAMUSCULAR | Status: DC | PRN
  Administered 2011-11-30: 10 mL

## 2011-11-30 MED ORDER — FENTANYL CITRATE 0.05 MG/ML IJ SOLN
25.0000 ug | INTRAMUSCULAR | Status: DC | PRN
  Administered 2011-11-30 (×2): 50 ug via INTRAVENOUS

## 2011-11-30 MED ORDER — KCL IN DEXTROSE-NACL 20-5-0.45 MEQ/L-%-% IV SOLN
INTRAVENOUS | Status: DC
  Administered 2011-11-30: 11:00:00 via INTRAVENOUS

## 2011-11-30 MED ORDER — BUPIVACAINE-EPINEPHRINE PF 0.5-1:200000 % IJ SOLN
INTRAMUSCULAR | Status: AC
  Filled 2011-11-30: qty 10

## 2011-11-30 MED ORDER — ONDANSETRON HCL 4 MG PO TABS: 8.0000 mg | ORAL_TABLET | Freq: Four times a day (QID) | ORAL | Status: DC | PRN

## 2011-11-30 MED ORDER — MIDAZOLAM HCL 2 MG/2ML IJ SOLN
INTRAMUSCULAR | Status: AC
  Filled 2011-11-30: qty 2

## 2011-11-30 MED ORDER — PROPOFOL 10 MG/ML IV EMUL
INTRAVENOUS | Status: AC
  Filled 2011-11-30: qty 20

## 2011-11-30 MED ORDER — MIDAZOLAM HCL 2 MG/2ML IJ SOLN
1.0000 mg | INTRAMUSCULAR | Status: DC | PRN
  Administered 2011-11-30 (×2): 2 mg via INTRAVENOUS

## 2011-11-30 MED ORDER — ONDANSETRON HCL 4 MG/2ML IJ SOLN
INTRAMUSCULAR | Status: AC
  Filled 2011-11-30: qty 2

## 2011-11-30 MED ORDER — ZOLPIDEM TARTRATE 5 MG PO TABS: 5.0000 mg | ORAL_TABLET | Freq: Every evening | ORAL | Status: DC | PRN

## 2011-11-30 MED ORDER — CIPROFLOXACIN HCL 500 MG PO TABS: 500.0000 mg | ORAL_TABLET | Freq: Two times a day (BID) | ORAL | Status: AC

## 2011-11-30 SURGICAL SUPPLY — 61 items
ADH SKN CLS APL DERMABOND .7 (GAUZE/BANDAGES/DRESSINGS)
APPLIER CLIP 11 MED OPEN (CLIP)
APPLIER CLIP 13 LRG OPEN (CLIP) ×3
APR CLP LRG 13 20 CLIP (CLIP) ×2
APR CLP MED 11 20 MLT OPN (CLIP)
BAG HAMPER (MISCELLANEOUS) ×3 IMPLANT
BRR ADH 6X5 SEPRAFILM 1 SHT (MISCELLANEOUS)
CELLS DAT CNTRL 66122 CELL SVR (MISCELLANEOUS) IMPLANT
CLIP APPLIE 11 MED OPEN (CLIP) ×2 IMPLANT
CLIP APPLIE 13 LRG OPEN (CLIP) IMPLANT
CLOTH BEACON ORANGE TIMEOUT ST (SAFETY) ×3 IMPLANT
COVER LIGHT HANDLE STERIS (MISCELLANEOUS) ×6 IMPLANT
DECANTER SPIKE VIAL GLASS SM (MISCELLANEOUS) ×3 IMPLANT
DERMABOND ADVANCED (GAUZE/BANDAGES/DRESSINGS)
DERMABOND ADVANCED .7 DNX12 (GAUZE/BANDAGES/DRESSINGS) IMPLANT
DRAPE PROXIMA HALF (DRAPES) ×3 IMPLANT
DRAPE STERI URO 9X17 APER PCH (DRAPES) ×3 IMPLANT
DRAPE WARM FLUID 44X44 (DRAPE) ×3 IMPLANT
DRESSING TELFA 8X3 (GAUZE/BANDAGES/DRESSINGS) ×2 IMPLANT
ELECT REM PT RETURN 9FT ADLT (ELECTROSURGICAL) ×3
ELECTRODE REM PT RTRN 9FT ADLT (ELECTROSURGICAL) ×2 IMPLANT
FORMALIN 10 PREFIL 480ML (MISCELLANEOUS) ×3 IMPLANT
GAUZE PACKING 2X5 YD STERILE (GAUZE/BANDAGES/DRESSINGS) ×2 IMPLANT
GLOVE BIOGEL PI IND STRL 8 (GLOVE) ×2 IMPLANT
GLOVE BIOGEL PI INDICATOR 8 (GLOVE) ×1
GLOVE ECLIPSE 6.5 STRL STRAW (GLOVE) ×2 IMPLANT
GLOVE ECLIPSE 8.0 STRL XLNG CF (GLOVE) ×3 IMPLANT
GLOVE EXAM NITRILE MD LF STRL (GLOVE) ×1 IMPLANT
GLOVE INDICATOR 7.0 STRL GRN (GLOVE) ×2 IMPLANT
GOWN STRL REIN XL XLG (GOWN DISPOSABLE) ×9 IMPLANT
INST SET MAJOR GENERAL (KITS) ×3 IMPLANT
KIT ROOM TURNOVER AP CYSTO (KITS) ×3 IMPLANT
KIT ROOM TURNOVER APOR (KITS) ×3 IMPLANT
MANIFOLD NEPTUNE II (INSTRUMENTS) ×3 IMPLANT
NEEDLE HYPO 22GX1.5 SAFETY (NEEDLE) ×3 IMPLANT
NS IRRIG 1000ML POUR BTL (IV SOLUTION) ×6 IMPLANT
PACK ABDOMINAL MAJOR (CUSTOM PROCEDURE TRAY) ×3 IMPLANT
PACK PERI GYN (CUSTOM PROCEDURE TRAY) ×3 IMPLANT
PAD ARMBOARD 7.5X6 YLW CONV (MISCELLANEOUS) ×3 IMPLANT
RETRACTOR WND ALEXIS 18 MED (MISCELLANEOUS) IMPLANT
RETRACTOR WND ALEXIS 25 LRG (MISCELLANEOUS) IMPLANT
RTRCTR WOUND ALEXIS 18CM MED (MISCELLANEOUS)
RTRCTR WOUND ALEXIS 25CM LRG (MISCELLANEOUS)
SEPRAFILM MEMBRANE 5X6 (MISCELLANEOUS) ×2 IMPLANT
SET BASIN LINEN APH (SET/KITS/TRAYS/PACK) ×3 IMPLANT
SET IV ADMIN VERSALIGHT (MISCELLANEOUS) ×3 IMPLANT
STAPLER VISISTAT 35W (STAPLE) ×2 IMPLANT
SUT CHROMIC 0 CT 1 (SUTURE) ×2 IMPLANT
SUT MNCRL+ AB 3-0 CT1 36 (SUTURE) ×2 IMPLANT
SUT MON AB 3-0 SH 27 (SUTURE) ×1 IMPLANT
SUT MONOCRYL AB 3-0 CT1 36IN (SUTURE)
SUT VIC AB 0 CT1 27 (SUTURE) ×9
SUT VIC AB 0 CT1 27XBRD ANTBC (SUTURE) ×2 IMPLANT
SUT VIC AB 0 CT1 27XCR 8 STRN (SUTURE) ×4 IMPLANT
SUT VIC AB 0 CT2 8-18 (SUTURE) ×2 IMPLANT
SUT VIC AB 0 CTX 36 (SUTURE)
SUT VIC AB 0 CTX36XBRD ANTBCTR (SUTURE) ×2 IMPLANT
SUT VICRYL 3 0 (SUTURE) IMPLANT
SYR CONTROL 10ML LL (SYRINGE) ×3 IMPLANT
TRAY FOLEY CATH 14FR (SET/KITS/TRAYS/PACK) ×3 IMPLANT
VERSALIGHT (MISCELLANEOUS) ×3 IMPLANT

## 2011-11-30 NOTE — Progress Notes (Signed)
Discharge instructions given to patient with no questions. Patient left facility in wheelchair with family.

## 2011-11-30 NOTE — Op Note (Signed)
Preoperative diagnosis:  1.  Menometrorrhagia                                         2.  Dysmenorrhea                                         3.  RLQ pain                                         4.  Right sided and midline dyspareunia                                         5.  History of endometrial ablation in 2009  Postoperative diagnosis:  Same as above   Procedure:  Vaginal hysterectomy with removal of right tube and ovary  Surgeon:  Lazaro Arms MD  Anesthesia:  General Endotracheal  Findings:  The patient underwent and endometrial ablation in 2009 and initially had positive results.  However her heavy painful menstrual bleeding has returned over the past 2 years.  Additionally she has chronic right lower quadrant pain consistent with ovarian origin as well as midline and right-sided dyspareunia.  Her left adnexa and left lower quadrant are not involved with any of her pain and as a result we are planning to preserve her left ovary.  Preoperative ultrasound was unremarkable except for some small intramural myomas.  Intraoperative findings were normal appearing uterus possibly adenomyosis.  Both ovaries appeared to be normal as well.  There was no evidence of infection adhesions or endometriosis present.  Description of operation:  The patient was taken from the preoperative area to the operating room in stable condition. She was placed in the sitting position and underwent a general anesthetic. She was placed in the dorsal lithotomy position using candy cane stirrups. Patient was prepped and draped in the usual sterile fashion and a Foley catheter was placed.  A weighted speculum was placed and the cervix was grasped with thyroid tenaculums both anteriorly and posteriorly.  0.5% Marcaine with epinephrine was injected in a circumferential fashion about the cervix and the electrocautery unit was used to incise the vagina and push off cervix.  The posterior cul-de-sac was then entered sharply  without difficulty.  The uterosacral ligaments were clamped cut and transfixion suture ligated and held.  The cardinal ligaments were then clamped cut transfixion suture ligated and cut. The anterior peritoneum was identified the anterior cul-de-sac was entered sharply without difficulty. The anterior and posterior leaves of the broad ligament were plicated and the uterine vessels were clamped cut and suture ligated. Serial pedicles were taken of the fundus with each pedicle being clamped cut and suture ligated. The utero-ovarian ligaments were crossclamped the uterus was removed and both pedicles were transfixion suture ligated. There was good hemostasis of all the pedicles.  The right infundibulopelvic ligament on the right was crossclamped cut and transfixion suture ligated.  A large Hemoclip was placed above the suture to prevent the peritoneum slipping through the clamp when it was released.  The peritoneum was then closed in a pursestring  fashion using 3-0 Vicryl. The anterior posterior vagina was closed in interrupted fashion with good resultant hemostasis. After closure the lower pelvis and vagina were irrigated vigorously.  The sponge needle and instrument counts were correct x 3.  Total blood loss for the procedure was 50 cc.  The patient received 1 g of Ancef and 30 mg of Toradol IV preoperatively prophylactically.  She was taken to the recovery room in good stable condition awake alert doing well.  EURE,LUTHER H 11/30/2011, 9:20 AM

## 2011-11-30 NOTE — Interval H&P Note (Signed)
History and Physical Interval Note:  11/30/2011 7:31 AM  Stacey Booth  has presented today for surgery, with the diagnosis of dyspareunia menometrorrhagia dysmenorrhea  The various methods of treatment have been discussed with the patient and family. After consideration of risks, benefits and other options for treatment, the patient has consented to  Procedure(s): HYSTERECTOMY VAGINAL SALPINGO OOPHERECTOMY as a surgical intervention .  The patients' history has been reviewed, patient examined, no change in status, stable for surgery.  I have reviewed the patients' chart and labs.  Questions were answered to the patient's satisfaction.     EURE,LUTHER H

## 2011-11-30 NOTE — Transfer of Care (Signed)
Immediate Anesthesia Transfer of Care Note  Patient: Stacey Booth  Procedure(s) Performed:  HYSTERECTOMY VAGINAL - transvaginal hysterectomy with right salpingo-oophorectomy; SALPINGO OOPHERECTOMY  Patient Location: PACU  Anesthesia Type: General  Level of Consciousness: awake and alert   Airway & Oxygen Therapy: Patient Spontanous Breathing and Patient connected to face mask oxygen  Post-op Assessment: Report given to PACU RN and Post -op Vital signs reviewed and stable  Post vital signs: Reviewed and stable  Complications: No apparent anesthesia complications

## 2011-11-30 NOTE — Discharge Summary (Signed)
Physician Discharge Summary  Patient ID: JENAY MORICI MRN: [id number] DOB/AGE: 30-Dec-1975 36 y.o.  Admit date: 11/30/2011 Discharge date: 11/30/2011  Admission Diagnoses: Menometrorrhagia  Dysmenorrhea Dyspareunia RLQ pain Discharge Diagnoses:  Active Problems:  * No active hospital problems. *    Discharged Condition: good  Hospital Course: unremarkable  Consults: None  Significant Diagnostic Studies: none  Treatments: surgery: TVHRSO  Discharge Exam: Blood pressure 124/77, pulse 81, temperature 98.5 F (36.9 C), temperature source Oral, resp. rate 16, height 5\' 6"  (1.676 m), weight 149 lb 14.6 oz (68 kg), SpO2 99.00%. General appearance: alert, cooperative and no distress GI: soft, non-tender; bowel sounds normal; no masses,  no organomegaly  Disposition: Home or Self Care  Discharge Orders    Future Orders Please Complete By Expires   Diet - low sodium heart healthy      Increase activity slowly      Discharge instructions      Comments:   Nothing in vagina and no sex for 6 weeks   Call MD for:  temperature >100.4      Call MD for:  persistant nausea and vomiting      Call MD for:  severe uncontrolled pain        Medication List  As of 11/30/2011  3:35 PM   STOP taking these medications         ibuprofen 200 MG tablet      megestrol 40 MG tablet         TAKE these medications         Aspirin-Caffeine 400-32 MG Tabs   Take 2 tablets by mouth every 6 (six) hours as needed. For pain      ciprofloxacin 500 MG tablet   Commonly known as: CIPRO   Take 1 tablet (500 mg total) by mouth 2 (two) times daily.      ketorolac 10 MG tablet   Commonly known as: TORADOL   Take 1 tablet (10 mg total) by mouth every 8 (eight) hours as needed for pain.      ondansetron 8 MG tablet   Commonly known as: ZOFRAN   Take 1 tablet (8 mg total) by mouth every 6 (six) hours as needed for nausea.      oxyCODONE-acetaminophen 5-325 MG per tablet   Commonly known as:  PERCOCET   Take 1-2 tablets by mouth every 4 (four) hours as needed (moderate to severe pain (when tolerating fluids)).           Follow-up Information    Follow up in 1 week.         SignedLazaro Arms 11/30/2011, 3:35 PM

## 2011-11-30 NOTE — Anesthesia Preprocedure Evaluation (Signed)
Anesthesia Evaluation  Patient identified by MRN, date of birth, ID band Patient awake    Reviewed: Allergy & Precautions, H&P , NPO status , Patient's Chart, lab work & pertinent test results  History of Anesthesia Complications (+) PONV  Airway Mallampati: I      Dental  (+) Teeth Intact and Implants   Pulmonary Current Smoker,  clear to auscultation        Cardiovascular neg cardio ROS Regular Normal    Neuro/Psych    GI/Hepatic   Endo/Other    Renal/GU      Musculoskeletal  (+) Arthritis - (chronic LBP),   Abdominal   Peds  Hematology   Anesthesia Other Findings   Reproductive/Obstetrics                           Anesthesia Physical Anesthesia Plan  ASA: II  Anesthesia Plan: General   Post-op Pain Management:    Induction: Intravenous  Airway Management Planned: Oral ETT  Additional Equipment:   Intra-op Plan:   Post-operative Plan: Extubation in OR  Informed Consent: I have reviewed the patients History and Physical, chart, labs and discussed the procedure including the risks, benefits and alternatives for the proposed anesthesia with the patient or authorized representative who has indicated his/her understanding and acceptance.     Plan Discussed with:   Anesthesia Plan Comments:         Anesthesia Quick Evaluation

## 2011-11-30 NOTE — Preoperative (Signed)
Beta Blockers   Reason not to administer Beta Blockers:Not Applicable 

## 2011-12-01 NOTE — Anesthesia Postprocedure Evaluation (Signed)
  Anesthesia Post-op Note  Patient: Stacey Booth  Procedure(s) Performed:  HYSTERECTOMY VAGINAL - transvaginal hysterectomy with right salpingo-oophorectomy; SALPINGO OOPHERECTOMY  Patient Location: 3a  Anesthesia Type: General  Level of Consciousness: awake, alert , oriented and patient cooperative  Airway and Oxygen Therapy: Patient Spontanous Breathing  Post-op Pain: none  Post-op Assessment: Post-op Vital signs reviewed, Patient's Cardiovascular Status Stable, Respiratory Function Stable, Patent Airway, No signs of Nausea or vomiting, Adequate PO intake and Pain level controlled  Post-op Vital Signs: Reviewed and stable  Complications: No apparent anesthesia complications

## 2011-12-05 ENCOUNTER — Encounter (HOSPITAL_COMMUNITY): Payer: Self-pay | Admitting: Obstetrics & Gynecology

## 2012-03-10 ENCOUNTER — Emergency Department (HOSPITAL_COMMUNITY)
Admission: EM | Admit: 2012-03-10 | Discharge: 2012-03-10 | Disposition: A | Discharge status: Home / Self Care | Payer: Medicaid Other | Attending: Emergency Medicine | Admitting: Emergency Medicine

## 2012-03-10 ENCOUNTER — Emergency Department (HOSPITAL_COMMUNITY): Payer: Medicaid Other

## 2012-03-10 ENCOUNTER — Encounter (HOSPITAL_COMMUNITY): Payer: Self-pay | Admitting: Emergency Medicine

## 2012-03-10 DIAGNOSIS — R109 Unspecified abdominal pain: Secondary | ICD-10-CM | POA: Insufficient documentation

## 2012-03-10 DIAGNOSIS — R350 Frequency of micturition: Secondary | ICD-10-CM | POA: Insufficient documentation

## 2012-03-10 DIAGNOSIS — Z87442 Personal history of urinary calculi: Secondary | ICD-10-CM | POA: Insufficient documentation

## 2012-03-10 DIAGNOSIS — F17200 Nicotine dependence, unspecified, uncomplicated: Secondary | ICD-10-CM | POA: Insufficient documentation

## 2012-03-10 DIAGNOSIS — R110 Nausea: Secondary | ICD-10-CM | POA: Insufficient documentation

## 2012-03-10 DIAGNOSIS — R319 Hematuria, unspecified: Secondary | ICD-10-CM | POA: Insufficient documentation

## 2012-03-10 DIAGNOSIS — R3915 Urgency of urination: Secondary | ICD-10-CM | POA: Insufficient documentation

## 2012-03-10 LAB — BASIC METABOLIC PANEL
CO2: 26 mEq/L (ref 19–32)
Calcium: 9.6 mg/dL (ref 8.4–10.5)
GFR calc non Af Amer: 78 mL/min — ABNORMAL LOW (ref >90)
Potassium: 4.2 mEq/L (ref 3.5–5.1)
Sodium: 138 mEq/L (ref 135–145)

## 2012-03-10 LAB — URINALYSIS, ROUTINE W REFLEX MICROSCOPIC
Glucose, UA: NEGATIVE mg/dL
Leukocytes, UA: NEGATIVE
Protein, ur: NEGATIVE mg/dL
Specific Gravity, Urine: 1.015 (ref 1.005–1.030)
Urobilinogen, UA: 0.2 mg/dL (ref 0.0–1.0)

## 2012-03-10 MED ORDER — HYDROMORPHONE HCL PF 1 MG/ML IJ SOLN
1.0000 mg | Freq: Once | INTRAMUSCULAR | Status: AC
  Administered 2012-03-10: 1 mg via INTRAVENOUS
  Filled 2012-03-10: qty 1

## 2012-03-10 MED ORDER — ONDANSETRON HCL 4 MG/2ML IJ SOLN
4.0000 mg | Freq: Once | INTRAMUSCULAR | Status: AC
  Administered 2012-03-10: 4 mg via INTRAVENOUS
  Filled 2012-03-10: qty 2

## 2012-03-10 NOTE — ED Provider Notes (Signed)
Medical screening examination/treatment/procedure(s) were performed by non-physician practitioner and as supervising physician I was immediately available for consultation/collaboration.   Shelda Jakes, MD 03/10/12 [phone]

## 2012-03-10 NOTE — ED Provider Notes (Signed)
History     CSN: [id number]  Arrival date & time 03/10/12  0944   First MD Initiated Contact with Patient 03/10/12 [phone]      Chief Complaint  Patient presents with  . Flank Pain    (Consider location/radiation/quality/duration/timing/severity/associated sxs/prior treatment) HPI Comments: Stacey Booth presents for evaluation of a four-day history of right-sided flank pain which has been waxing and waning in character with increasingly dark urine and frequency of urination.  She does have a history of kidney stones.  She does have nausea, denies fevers or chills, no vomiting and no abdominal pain reported.  Pain is constant, she has tried Anacin alternating with ibuprofen with no relief of her symptoms.  She has increased her fluid intake, but without passage of suspected kidney stone.   Patient is a 36 y.o. female presenting with flank pain. The history is provided by the patient.  Flank Pain Associated symptoms include nausea. Pertinent negatives include no abdominal pain, arthralgias, chest pain, congestion, fever, headaches, joint swelling, neck pain, numbness, rash, sore throat, vomiting or weakness.    Past Medical History  Diagnosis Date  . PONV (postoperative nausea and vomiting)   . Renal disorder     kidney stones  . Arthritis     Past Surgical History  Procedure Date  . Cystoscopy 5-6 months ago    [place]  . Cholecystectomy   . Tubal ligation   . Tonsillectomy   . Back surgery     ruptured disc-laparoscopic repair  . Vaginal hysterectomy 11/30/2011    Procedure: HYSTERECTOMY VAGINAL;  Surgeon: Lazaro Arms, MD;  Location: [facility];  Service: Gynecology;  Laterality: N/A;  transvaginal hysterectomy with right salpingo-oophorectomy  . Salpingoophorectomy 11/30/2011    Procedure: SALPINGO OOPHERECTOMY;  Surgeon: Lazaro Arms, MD;  Location: [facility];  Service: Gynecology;  Laterality: Right;    Family History  Problem Relation Age of Onset  . Anesthesia problems Neg  Hx   . Hypotension Neg Hx   . Malignant hyperthermia Neg Hx   . Pseudochol deficiency Neg Hx     History  Substance Use Topics  . Smoking status: Current Everyday Smoker -- 0.2 packs/day for 15 years    Types: Cigarettes  . Smokeless tobacco: Not on file  . Alcohol Use: Yes     rarely    OB History    Grav Para Term Preterm Abortions TAB SAB Ect Mult Living                  Review of Systems  Constitutional: Negative for fever.  HENT: Negative for congestion, sore throat and neck pain.   Eyes: Negative.   Respiratory: Negative for chest tightness and shortness of breath.   Cardiovascular: Negative for chest pain.  Gastrointestinal: Positive for nausea. Negative for vomiting and abdominal pain.  Genitourinary: Positive for urgency, frequency, hematuria and flank pain.  Musculoskeletal: Negative for joint swelling and arthralgias.  Skin: Negative.  Negative for rash and wound.  Neurological: Negative for dizziness, weakness, light-headedness, numbness and headaches.  Hematological: Negative.   Psychiatric/Behavioral: Negative.     Allergies  Penicillins  Home Medications   Current Outpatient Rx  Name Route Sig Dispense Refill  . ASPIRIN-CAFFEINE 400-32 MG PO TABS Oral Take 2 tablets by mouth every 6 (six) hours as needed. For pain      BP 123/82  Pulse 104  Temp(Src) 98.1 F (36.7 C) (Oral)  Resp 20  Ht 5\' 7"  (1.702 m)  Wt 160  lb (72.576 kg)  BMI 25.06 kg/m2  SpO2 100%  LMP 11/02/2011  Physical Exam  Nursing note and vitals reviewed. Constitutional: She appears well-developed and well-nourished.  HENT:  Head: Normocephalic and atraumatic.  Eyes: Conjunctivae are normal.  Neck: Normal range of motion.  Cardiovascular: Normal rate, regular rhythm, normal heart sounds and intact distal pulses.   Pulmonary/Chest: Effort normal and breath sounds normal. She has no wheezes.  Abdominal: Soft. Bowel sounds are normal. There is no hepatosplenomegaly. There is no  tenderness. There is CVA tenderness. There is no rebound and no guarding.       CVA tenderness right.  Also reports pain in right upper quadrant which is not worsened with palpation.  Negative Murphy sign.  Musculoskeletal: Normal range of motion.  Neurological: She is alert.  Skin: Skin is warm and dry.  Psychiatric: She has a normal mood and affect.    ED Course  Procedures (including critical care time)   Labs Reviewed  URINALYSIS, ROUTINE W REFLEX MICROSCOPIC  BASIC METABOLIC PANEL   No results found.   No diagnosis found.  10:33 AM Patient given Dilaudid 1 mg and Zofran 4 mg IV.  With complete pain relief.   1:00 PM  discussed lab results with patient.  Also discussed recent CT scan from December which was a normal study.  Plan to repeat CT kidney stone protocol today after discussion with Dr. Gustavus Messing.  Patient agreeable.    MDM  CT scan dated 10/15/2011 reviewed with no evidence of urinary tract stone at that time.  Notified by RN that patient had left without notifying him or any other personnel.  She left prior to getting her abdominal pelvis kidney stone protocol CT scan.  I was unable to discuss potential risks of leaving AMA  with patient prior to her leaving, but she was stable and comfortable when last seen at 1 PM.      Burgess Amor, PA 03/10/12 1551  Burgess Amor, PA 03/10/12 1552

## 2012-03-10 NOTE — ED Notes (Signed)
Eustace Quail RN called pt and left message for pt to call back because was concerned pt left with IV in arm.  Called patient again but no answer.  Called [employer] and spoke with Holley Dexter and was told they would send a deputy to address listed to confirm IV out of arm.

## 2012-03-10 NOTE — ED Notes (Signed)
Pt c/o right sided flank pain x 4 days. Pt states she has hx of kidney stones and sx are similar.

## 2012-03-10 NOTE — ED Notes (Signed)
Pt c/o rt flank pain x 4 days with dark urine.

## 2012-03-10 NOTE — ED Notes (Signed)
Holley Dexter from c com called back and reports deputy verified pt had already taken the IV out.

## 2013-01-25 ENCOUNTER — Ambulatory Visit (INDEPENDENT_AMBULATORY_CARE_PROVIDER_SITE_OTHER): Payer: Medicaid Other | Admitting: Obstetrics & Gynecology

## 2013-01-25 ENCOUNTER — Encounter: Payer: Self-pay | Admitting: Obstetrics & Gynecology

## 2013-01-25 VITALS — BP 134/98 | Ht 66.0 in | Wt 167.0 lb

## 2013-01-25 DIAGNOSIS — R51 Headache: Secondary | ICD-10-CM

## 2013-01-25 DIAGNOSIS — N951 Menopausal and female climacteric states: Secondary | ICD-10-CM | POA: Insufficient documentation

## 2013-01-25 MED ORDER — ESTRADIOL 0.52 MG/0.87 GM (0.06%) TD GEL: 2.0000 "application " | Freq: Every evening | TRANSDERMAL | Status: DC

## 2013-01-25 MED ORDER — ZOLPIDEM TARTRATE 10 MG PO TABS: 10.0000 mg | ORAL_TABLET | Freq: Every evening | ORAL | Status: DC | PRN

## 2013-01-25 NOTE — Patient Instructions (Signed)

## 2013-01-25 NOTE — Progress Notes (Signed)
Patient ID: Stacey Booth, female   DOB: 03-May-1976, 37 y.o.   MRN: [id number] Subjective:    Stacey Booth is a 37 y.o. female who presents for complaint of hot flases for 2 weeks.  The patient is sexually active. GYN screening history: s/p hysterectomy. The patient is not taking hormone replacement therapy. Patient denies post-menopausal vaginal bleeding.. The patient wears seatbelts: yes. The patient participates in regular exercise: yes. Has the patient ever been transfused or tattooed?: yes. The patient reports that there is not domestic violence in her life.   Menstrual History: OB History   Grav Para Term Preterm Abortions TAB SAB Ect Mult Living                  Menarche age: 33  Patient's last menstrual period was 11/02/2011.    The following portions of the patient's history were reviewed and updated as appropriate: allergies, current medications, past family history, past medical history, past social history, past surgical history and problem list.  Review of Systems Pertinent items are noted in HPI.    Objective:    No exam performed today, not needed.    Assessment:    Hormone replacement therapy Menopause    Plan:    Elestrin 2 pumps a day and ambien for sleep   Face to face 15 minutes

## 2013-02-22 ENCOUNTER — Encounter: Payer: Self-pay | Admitting: Obstetrics & Gynecology

## 2013-02-22 ENCOUNTER — Ambulatory Visit (INDEPENDENT_AMBULATORY_CARE_PROVIDER_SITE_OTHER): Payer: Medicaid Other | Admitting: Obstetrics & Gynecology

## 2013-02-22 VITALS — BP 120/80 | Wt 170.0 lb

## 2013-02-22 DIAGNOSIS — F329 Major depressive disorder, single episode, unspecified: Secondary | ICD-10-CM

## 2013-02-22 DIAGNOSIS — N951 Menopausal and female climacteric states: Secondary | ICD-10-CM

## 2013-02-22 DIAGNOSIS — G4700 Insomnia, unspecified: Secondary | ICD-10-CM

## 2013-02-22 MED ORDER — ESCITALOPRAM OXALATE 10 MG PO TABS: 10.0000 mg | ORAL_TABLET | Freq: Every day | ORAL | Status: DC

## 2013-02-22 MED ORDER — ZOLPIDEM TARTRATE 10 MG PO TABS: 10.0000 mg | ORAL_TABLET | Freq: Every evening | ORAL | Status: DC | PRN

## 2013-02-22 NOTE — Patient Instructions (Signed)
Escitalopram tablets What is this medicine? ESCITALOPRAM (es sye TAL oh pram) is used to treat depression and certain types of anxiety. This medicine may be used for other purposes; ask your health care provider or pharmacist if you have questions. What should I tell my health care provider before I take this medicine? They need to know if you have any of these conditions: -bipolar disorder or a family history of bipolar disorder -diabetes -heart disease -kidney or liver disease -receiving electroconvulsive therapy -seizures (convulsions) -suicidal thoughts, plans, or attempt by you or a family member -an unusual or allergic reaction to escitalopram, the related drug citalopram, other medicines, foods, dyes, or preservatives -pregnant or trying to become pregnant -breast-feeding How should I use this medicine? Take this medicine by mouth with a glass of water. Follow the directions on the prescription label. You can take it with or without food. If it upsets your stomach, take it with food. Take your medicine at regular intervals. Do not take it more often than directed. Do not stop taking this medicine suddenly except upon the advice of your doctor. Stopping this medicine too quickly may cause serious side effects or your condition may worsen. A special MedGuide will be given to you by the pharmacist with each prescription and refill. Be sure to read this information carefully each time. Talk to your pediatrician regarding the use of this medicine in children. Special care may be needed. Overdosage: If you think you have taken too much of this medicine contact a poison control center or emergency room at once. NOTE: This medicine is only for you. Do not share this medicine with others. What if I miss a dose? If you miss a dose, take it as soon as you can. If it is almost time for your next dose, take only that dose. Do not take double or extra doses. What may interact with this medicine? Do  not take this medicine with any of the following medications: -cisapride -citalopram -linezolid -MAOIs like Carbex, Eldepryl, Marplan, Nardil, and Parnate -methylene blue (injected into a vein) -pimozide This medicine may also interact with the following medications: -alcohol -aspirin and aspirin-like medicines -carbamazepine -certain medicines for depression, anxiety, or psychotic disturbances -certain medicines for migraine headache like almotriptan, eletriptan, frovatriptan, naratriptan, rizatriptan, sumatriptan, zolmitriptan -cimetidine -diuretics -fentanyl -furazolidone -isoniazid -ketoconazole -lithium -medicines that treat or prevent blood clots like warfarin, enoxaparin, and dalteparin -medicines for sleep -metoprolol -NSAIDs, medicines for pain and inflammation, like ibuprofen or naproxen -procarbazine -rasagiline -supplements like St. John's wort, kava kava, valerian -tramadol -tryptophan This list may not describe all possible interactions. Give your health care provider a list of all the medicines, herbs, non-prescription drugs, or dietary supplements you use. Also tell them if you smoke, drink alcohol, or use illegal drugs. Some items may interact with your medicine. What should I watch for while using this medicine? Tell your doctor if your symptoms do not get better or if they get worse. Visit your doctor or health care professional for regular checks on your progress. Because it may take several weeks to see the full effects of this medicine, it is important to continue your treatment as prescribed by your doctor. Patients and their families should watch out for new or worsening thoughts of suicide or depression. Also watch out for sudden changes in feelings such as feeling anxious, agitated, panicky, irritable, hostile, aggressive, impulsive, severely restless, overly excited and hyperactive, or not being able to sleep. If this happens, especially at the beginning of  treatment or after a change in dose, call your health care professional. Bonita Quin may get drowsy or dizzy. Do not drive, use machinery, or do anything that needs mental alertness until you know how this medicine affects you. Do not stand or sit up quickly, especially if you are an older patient. This reduces the risk of dizzy or fainting spells. Alcohol may interfere with the effect of this medicine. Avoid alcoholic drinks. Your mouth may get dry. Chewing sugarless gum or sucking hard candy, and drinking plenty of water may help. Contact your doctor if the problem does not go away or is severe. What side effects may I notice from receiving this medicine? Side effects that you should report to your doctor or health care professional as soon as possible: -allergic reactions like skin rash, itching or hives, swelling of the face, lips, or tongue -confusion -feeling faint or lightheaded, falls -fast talking and excited feelings or actions that are out of control -hallucination, loss of contact with reality -seizures -suicidal thoughts or other mood changes -unusual bleeding or bruising Side effects that usually do not require medical attention (report to your doctor or health care professional if they continue or are bothersome): -blurred vision -changes in appetite -change in sex drive or performance -headache -increased sweating -nausea This list may not describe all possible side effects. Call your doctor for medical advice about side effects. You may report side effects to FDA at 1-800-FDA-1088. Where should I keep my medicine? Keep out of reach of children. Store at room temperature between 15 and 30 degrees C (59 and 86 degrees F). Throw away any unused medicine after the expiration date. NOTE: This sheet is a summary. It may not cover all possible information. If you have questions about this medicine, talk to your doctor, pharmacist, or health care provider.  2013, [employer].  (02/24/2012 7:12:08 PM)

## 2013-02-22 NOTE — Progress Notes (Signed)
Patient ID: Stacey Booth, female   DOB: 10-Oct-1976, 37 y.o.   MRN: [id number] Patient states her vasomotor symptoms are quite a bit better, essentially completely resolved. Still having a good deal of emotional lability still has sleep issues if does not take ambien Has been on SSRI in past  Cont the topical estradiol 2 pumps per night Add lexapro 10 qd Cont ambien Follow up 1 month

## 2013-03-22 ENCOUNTER — Other Ambulatory Visit: Payer: Self-pay | Admitting: Obstetrics & Gynecology

## 2013-03-22 MED ORDER — ESTRADIOL 0.52 MG/0.87 GM (0.06%) TD GEL: 2.0000 "application " | Freq: Every evening | TRANSDERMAL | Status: DC

## 2013-03-22 NOTE — Telephone Encounter (Signed)
Elestrin refill e prescribed

## 2013-03-25 ENCOUNTER — Ambulatory Visit: Payer: Medicaid Other | Admitting: Obstetrics & Gynecology

## 2013-03-26 ENCOUNTER — Ambulatory Visit: Payer: Medicaid Other | Admitting: Obstetrics & Gynecology

## 2013-04-17 ENCOUNTER — Other Ambulatory Visit: Payer: Self-pay | Admitting: *Deleted

## 2013-04-17 NOTE — Telephone Encounter (Signed)
Dr Emelda Fear was contacted and he prescribed oral metronidazole 500 BID

## 2013-04-18 MED ORDER — ZOLPIDEM TARTRATE 10 MG PO TABS
10.0000 mg | ORAL_TABLET | Freq: Every evening | ORAL | Status: DC | PRN
Start: 2013-04-17 — End: 2014-02-18

## 2013-04-23 ENCOUNTER — Encounter: Payer: Self-pay | Admitting: Obstetrics & Gynecology

## 2013-04-23 ENCOUNTER — Ambulatory Visit (INDEPENDENT_AMBULATORY_CARE_PROVIDER_SITE_OTHER): Payer: Medicaid Other | Admitting: Obstetrics & Gynecology

## 2013-04-23 VITALS — BP 150/100 | Wt 173.0 lb

## 2013-04-23 DIAGNOSIS — F3289 Other specified depressive episodes: Secondary | ICD-10-CM

## 2013-04-23 DIAGNOSIS — F329 Major depressive disorder, single episode, unspecified: Secondary | ICD-10-CM

## 2013-04-23 DIAGNOSIS — N951 Menopausal and female climacteric states: Secondary | ICD-10-CM

## 2013-04-23 MED ORDER — ESCITALOPRAM OXALATE 10 MG PO TABS: 20.0000 mg | ORAL_TABLET | Freq: Every day | ORAL | Status: DC

## 2013-04-23 MED ORDER — ESTRADIOL 1 MG PO TABS: ORAL_TABLET | ORAL | Status: DC

## 2013-04-23 NOTE — Progress Notes (Signed)
Patient ID: Stacey Booth, female   DOB: 02-11-1976, 37 y.o.   MRN: [id number] [name] is in for a followup visit  She is been having a lot of menopausal symptoms because she stopped her hormone replacement therapy We placed her on an topical estrogen which worked very well However Medicaid has stopped covering it and we're going to convert her to esterase 1 mg twice a day  Additionally last visit I placed her on Lexapro 10 mg a day and she has had improvement I will increase her to 20 mg a day today  To continue used Ambien as needed  I will follow her up in 3 months

## 2013-04-23 NOTE — Patient Instructions (Signed)

## 2013-07-24 ENCOUNTER — Ambulatory Visit: Payer: Medicaid Other | Admitting: Obstetrics & Gynecology

## 2013-07-24 ENCOUNTER — Encounter: Payer: Self-pay | Admitting: *Deleted

## 2013-08-11 ENCOUNTER — Emergency Department (HOSPITAL_COMMUNITY): Payer: Medicaid Other

## 2013-08-11 ENCOUNTER — Encounter (HOSPITAL_COMMUNITY): Payer: Self-pay | Admitting: Emergency Medicine

## 2013-08-11 ENCOUNTER — Emergency Department (HOSPITAL_COMMUNITY)
Admission: EM | Admit: 2013-08-11 | Discharge: 2013-08-11 | Disposition: A | Discharge status: Home / Self Care | Payer: Medicaid Other | Attending: Emergency Medicine | Admitting: Emergency Medicine

## 2013-08-11 DIAGNOSIS — Z791 Long term (current) use of non-steroidal anti-inflammatories (NSAID): Secondary | ICD-10-CM | POA: Insufficient documentation

## 2013-08-11 DIAGNOSIS — M5412 Radiculopathy, cervical region: Secondary | ICD-10-CM

## 2013-08-11 DIAGNOSIS — F17200 Nicotine dependence, unspecified, uncomplicated: Secondary | ICD-10-CM | POA: Insufficient documentation

## 2013-08-11 DIAGNOSIS — Z880 Allergy status to penicillin: Secondary | ICD-10-CM | POA: Insufficient documentation

## 2013-08-11 DIAGNOSIS — M129 Arthropathy, unspecified: Secondary | ICD-10-CM | POA: Insufficient documentation

## 2013-08-11 DIAGNOSIS — Z79899 Other long term (current) drug therapy: Secondary | ICD-10-CM | POA: Insufficient documentation

## 2013-08-11 DIAGNOSIS — Z87442 Personal history of urinary calculi: Secondary | ICD-10-CM | POA: Insufficient documentation

## 2013-08-11 MED ORDER — OXYCODONE-ACETAMINOPHEN 5-325 MG PO TABS
1.0000 | ORAL_TABLET | Freq: Once | ORAL | Status: AC
  Administered 2013-08-11: 1 via ORAL
  Filled 2013-08-11: qty 1

## 2013-08-11 MED ORDER — CYCLOBENZAPRINE HCL 10 MG PO TABS: 10.0000 mg | ORAL_TABLET | Freq: Three times a day (TID) | ORAL | Status: DC | PRN

## 2013-08-11 MED ORDER — OXYCODONE-ACETAMINOPHEN 5-325 MG PO TABS: 1.0000 | ORAL_TABLET | ORAL | Status: DC | PRN

## 2013-08-11 MED ORDER — CYCLOBENZAPRINE HCL 10 MG PO TABS
10.0000 mg | ORAL_TABLET | Freq: Once | ORAL | Status: AC
  Administered 2013-08-11: 10 mg via ORAL
  Filled 2013-08-11: qty 1

## 2013-08-11 MED ORDER — NAPROXEN 500 MG PO TABS: 500.0000 mg | ORAL_TABLET | Freq: Two times a day (BID) | ORAL | Status: DC

## 2013-08-11 NOTE — ED Notes (Signed)
Pt c/o pain in neck, upper back, and left arm pain.  Reports history of bulging discs requiring steroid injections.  Reports pain started back again last night.  No injury.

## 2013-08-13 NOTE — ED Provider Notes (Signed)
CSN: [id number]     Arrival date & time 08/11/13  1220 History   First MD Initiated Contact with Patient 08/11/13 1233     Chief Complaint  Patient presents with  . Neck Pain   (Consider location/radiation/quality/duration/timing/severity/associated sxs/prior Treatment) Patient is a 37 y.o. female presenting with neck pain. The history is provided by the patient.  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 PONV (postoperative nausea and vomiting)   . Renal disorder     kidney stones  . Arthritis    Past Surgical History  Procedure Laterality Date  . Cystoscopy  5-6 months ago    [place]  . Cholecystectomy    . Tubal ligation    . Tonsillectomy    . Back surgery      ruptured disc-laparoscopic repair  . Vaginal hysterectomy  11/30/2011    Procedure: HYSTERECTOMY VAGINAL;  Surgeon: Lazaro Arms, MD;  Location: [facility];  Service: Gynecology;  Laterality: N/A;  transvaginal hysterectomy with right salpingo-oophorectomy  . Salpingoophorectomy  11/30/2011    Procedure: SALPINGO OOPHERECTOMY;  Surgeon: Lazaro Arms, MD;  Location: [facility];  Service: Gynecology;  Laterality: Right;   Family History  Problem Relation Age of Onset  . Anesthesia  problems Neg Hx   . Hypotension Neg Hx   . Malignant hyperthermia Neg Hx   . Pseudochol deficiency Neg Hx   . Cancer Father   . Diabetes Father   . Diabetes Maternal Grandmother    History  Substance Use Topics  . Smoking status: Current Every Day Smoker -- 1.00 packs/day for 15 years    Types: Cigarettes  . Smokeless tobacco: Not on file  . Alcohol Use: No     Comment: rarely   OB History   Grav Para Term Preterm Abortions TAB SAB Ect Mult Living                 Review of Systems  Constitutional: Negative for fever and chills.  Eyes: Negative for photophobia.  Cardiovascular: Negative for chest pain and syncope.  Gastrointestinal: Negative for nausea, vomiting and bowel incontinence.  Genitourinary: Negative for bladder incontinence, dysuria and difficulty urinating.  Musculoskeletal: Positive for arthralgias, joint swelling and neck pain. Negative for back pain and neck stiffness.  Skin: Negative for color change and wound.  Neurological: Negative for dizziness, tingling, seizures, syncope, facial asymmetry, speech difficulty, weakness, numbness and headaches.  All other systems reviewed and are negative.    Allergies  Penicillins  Home Medications   Current Outpatient Rx  Name  Route  Sig  Dispense  Refill  . Aspirin-Caffeine 400-32 MG TABS   Oral   Take 2 tablets by mouth every 6 (six) hours as  needed. For pain         . cyclobenzaprine (FLEXERIL) 10 MG tablet   Oral   Take 1 tablet (10 mg total) by mouth 3 (three) times daily as needed.   21 tablet   0   . escitalopram (LEXAPRO) 10 MG tablet   Oral   Take 2 tablets (20 mg total) by mouth daily.   30 tablet   2   . estradiol (ESTRACE) 1 MG tablet      Take 1 tablet twice daily   60 tablet   12   . ibuprofen (ADVIL,MOTRIN) 200 MG tablet   Oral   Take 600-800 mg by mouth every 6 (six) hours as needed. For pain         . naproxen (NAPROSYN) 500 MG tablet   Oral   Take 1 tablet (500 mg total)  by mouth 2 (two) times daily.   20 tablet   0   . oxyCODONE-acetaminophen (PERCOCET/ROXICET) 5-325 MG per tablet   Oral   Take 1 tablet by mouth every 4 (four) hours as needed for pain.   16 tablet   0   . EXPIRED: zolpidem (AMBIEN) 10 MG tablet   Oral   Take 1 tablet (10 mg total) by mouth at bedtime as needed for sleep.   14 tablet   5   . EXPIRED: zolpidem (AMBIEN) 10 MG tablet   Oral   Take 1 tablet (10 mg total) by mouth at bedtime as needed for sleep.   30 tablet   3    BP 144/90  Pulse 86  Temp(Src) 97.6 F (36.4 C) (Oral)  Resp 16  Ht 5\' 7"  (1.702 m)  Wt 175 lb (79.379 kg)  BMI 27.4 kg/m2  SpO2 100%  LMP 11/02/2011 Physical Exam  Nursing note and vitals reviewed. Constitutional: She is oriented to person, place, and time. She appears well-developed and well-nourished. No distress.  HENT:  Head: Normocephalic and atraumatic.  Mouth/Throat: Oropharynx is clear and moist.  Eyes: EOM are normal. Pupils are equal, round, and reactive to light.  Neck: Phonation normal. Muscular tenderness present. No spinous process tenderness present. No rigidity. Decreased range of motion present. No erythema present. No Brudzinski's sign and no Kernig's sign noted. No thyromegaly present.  ttp of the left cervical paraspinal muscles and along the left trapezius muscle.  Grip strength is strong and equal bilaterally.  Distal sensation intact,  CR < 2 sec.  Pt has full ROM of the left arm.  Pain to the left  neck is reproduced with rotation and palpation.    Cardiovascular: Normal rate, regular rhythm, normal heart sounds and intact distal pulses.   No murmur heard. Pulmonary/Chest: Effort normal and breath sounds normal. No respiratory distress. She exhibits no tenderness.  Musculoskeletal: She exhibits tenderness. She exhibits no edema.       Cervical back: She exhibits tenderness. She exhibits normal range of motion, no bony tenderness, no swelling, no deformity, no spasm and  normal pulse.  See neck exam  Lymphadenopathy:    She has no cervical adenopathy.  Neurological: She is alert and oriented to person, place, and time. She has normal strength. No sensory deficit. She exhibits normal muscle tone. Coordination normal.  Reflex Scores:      Tricep reflexes are 2+ on the right side and 2+ on the left side.      Bicep reflexes are 2+ on the right side and 2+ on the left side. Skin: Skin is  warm and dry.    ED Course  Procedures (including critical care time) Labs Review Labs Reviewed - No data to display Imaging Review Dg Cervical Spine Complete  08/11/2013   CLINICAL DATA:  Neck pain.  EXAM: CERVICAL SPINE  4+ VIEWS  COMPARISON:  None.  FINDINGS: Loss of normal cervical lordosis. No fracture or subluxation. Disc spaces are maintained. Prevertebral soft tissues are normal.  IMPRESSION: Cervical straightening. No acute bony abnormality.   Electronically Signed   By: Charlett Nose M.D.   On: 08/11/2013 14:28    EKG Interpretation   None       MDM   1. Cervical radicular pain    Patient has ttp of the left cervical spine and  paraspinal muscles.  No focal neuro deficits on exam. Moves both UE's w/o difficulty.   Ambulates with a steady gait.   No concerning sx's for emergent neurological or infectious process.  Pt requests referral to her previous neurosurgeon, Dr. Gerlene Fee.   She appears stable for discharge.  Feels better after po medications    Masaki Rothbauer L. Trisha Mangle, PA-C 08/13/13 1959

## 2013-08-20 NOTE — ED Provider Notes (Signed)
Medical screening examination/treatment/procedure(s) were performed by non-physician practitioner and as supervising physician I was immediately available for consultation/collaboration.  EKG Interpretation   None        Korina Tretter, MD 08/20/13 1736 

## 2013-08-28 ENCOUNTER — Telehealth: Payer: Self-pay | Admitting: Obstetrics and Gynecology

## 2013-08-28 ENCOUNTER — Other Ambulatory Visit: Payer: Self-pay | Admitting: Obstetrics & Gynecology

## 2013-08-28 NOTE — Telephone Encounter (Signed)
Pt will have to pick up at front desk

## 2013-08-28 NOTE — Telephone Encounter (Signed)
Spoke with pt and let her know she can pick up Ambien Rx at front desk tomorrow after 11 am. Pt voiced understanding. JSY

## 2013-08-29 ENCOUNTER — Telehealth: Payer: Self-pay | Admitting: Obstetrics & Gynecology

## 2013-08-29 NOTE — Telephone Encounter (Signed)
Pt informed RX at front desk for pt to pick up.

## 2013-09-27 ENCOUNTER — Other Ambulatory Visit: Payer: Self-pay | Admitting: Obstetrics & Gynecology

## 2013-10-07 ENCOUNTER — Ambulatory Visit (INDEPENDENT_AMBULATORY_CARE_PROVIDER_SITE_OTHER): Payer: Medicaid Other | Admitting: Obstetrics & Gynecology

## 2013-10-07 ENCOUNTER — Encounter: Payer: Self-pay | Admitting: Obstetrics & Gynecology

## 2013-10-07 ENCOUNTER — Encounter (INDEPENDENT_AMBULATORY_CARE_PROVIDER_SITE_OTHER): Payer: Self-pay

## 2013-10-07 VITALS — BP 120/90 | Wt 181.0 lb

## 2013-10-07 DIAGNOSIS — F329 Major depressive disorder, single episode, unspecified: Secondary | ICD-10-CM

## 2013-10-07 NOTE — Progress Notes (Signed)
Patient ID: Stacey Booth, female   DOB: 05/08/1976, 37 y.o.   MRN: [id number] Pt stopped her lexapro months ago She seems to think she is bipolar  i recommend she sees her son's psychiatrist, [facility] i will no longer manage her depression issues or her sleep  Past Medical History  Diagnosis Date  . PONV (postoperative nausea and vomiting)   . Renal disorder     kidney stones  . Arthritis     Past Surgical History  Procedure Laterality Date  . Cystoscopy  5-6 months ago    [place]  . Cholecystectomy    . Tubal ligation    . Tonsillectomy    . Back surgery      ruptured disc-laparoscopic repair  . Vaginal hysterectomy  11/30/2011    Procedure: HYSTERECTOMY VAGINAL;  Surgeon: Lazaro Arms, MD;  Location: [facility];  Service: Gynecology;  Laterality: N/A;  transvaginal hysterectomy with right salpingo-oophorectomy  . Salpingoophorectomy  11/30/2011    Procedure: SALPINGO OOPHERECTOMY;  Surgeon: Lazaro Arms, MD;  Location: [facility];  Service: Gynecology;  Laterality: Right;    OB History   Grav Para Term Preterm Abortions TAB SAB Ect Mult Living                  Allergies  Allergen Reactions  . Penicillins Hives    History   Social History  . Marital Status: Married    Spouse Name: N/A    Number of Children: N/A  . Years of Education: N/A   Social History Main Topics  . Smoking status: Current Every Day Smoker -- 1.00 packs/day for 15 years    Types: Cigarettes  . Smokeless tobacco: None  . Alcohol Use: No     Comment: rarely  . Drug Use: No  . Sexual Activity: Yes    Birth Control/ Protection: Surgical   Other Topics Concern  . None   Social History Narrative  . None    Family History  Problem Relation Age of Onset  . Anesthesia problems Neg Hx   . Hypotension Neg Hx   . Malignant hyperthermia Neg Hx   . Pseudochol deficiency Neg Hx   . Cancer Father   . Diabetes Father   . Diabetes Maternal Grandmother

## 2013-10-28 ENCOUNTER — Other Ambulatory Visit: Payer: Self-pay | Admitting: Obstetrics & Gynecology

## 2014-01-15 ENCOUNTER — Other Ambulatory Visit: Payer: Self-pay | Admitting: Obstetrics & Gynecology

## 2014-02-06 ENCOUNTER — Other Ambulatory Visit: Payer: Self-pay | Admitting: Obstetrics & Gynecology

## 2014-02-18 ENCOUNTER — Encounter: Payer: Self-pay | Admitting: Adult Health

## 2014-02-18 ENCOUNTER — Ambulatory Visit (INDEPENDENT_AMBULATORY_CARE_PROVIDER_SITE_OTHER): Payer: Medicaid Other | Admitting: Adult Health

## 2014-02-18 VITALS — BP 146/102 | Ht 67.0 in | Wt 177.0 lb

## 2014-02-18 DIAGNOSIS — N898 Other specified noninflammatory disorders of vagina: Secondary | ICD-10-CM

## 2014-02-18 DIAGNOSIS — N951 Menopausal and female climacteric states: Secondary | ICD-10-CM

## 2014-02-18 DIAGNOSIS — I10 Essential (primary) hypertension: Secondary | ICD-10-CM

## 2014-02-18 DIAGNOSIS — N9489 Other specified conditions associated with female genital organs and menstrual cycle: Secondary | ICD-10-CM

## 2014-02-18 HISTORY — DX: Other specified noninflammatory disorders of vagina: N89.8

## 2014-02-18 MED ORDER — PROPRANOLOL HCL 20 MG PO TABS: ORAL_TABLET | ORAL | Status: DC

## 2014-02-18 NOTE — Progress Notes (Signed)
Subjective:     Patient ID: Bernie CoveyRobin S Llanas, female   DOB: [birth year], 38 y.o.   MRN: [id number]  HPI [name] is a 38 year old white female in complaining of elevated BP and hot flashes and vaginal dryness after sex. Is on estrace 1 mg bid and on meds for ADD and depression.  Review of Systems See HPI Reviewed past medical,surgical, social and family history. Reviewed medications and allergies.     Objective:   Physical Exam BP 146/102  Ht 5\' 7"  (1.702 m)  Wt 177 lb (80.287 kg)  BMI 27.72 kg/m2  LMP 01/09/2013sp hysto.   HR 96, talk only, complains of hot flashes, vaginal dryness,panic attacks, does not want to go out, sees therapist and is on meds. Wants to know if hormones, told her she is taking ET, test won't help while on meds.Discussed with Dr Despina HiddenEure, as he has seen her in past, he thinks it is all emotional.  Assessment:     Vaginal dryness Hypertension Hot flashes    Plan:     Rx Inderal 20 mg #30 1 daily with 6 refills Try luvena and astroglide with sex Follow up in  4 weeks, check fasting labs Review handout on hypertension

## 2014-02-18 NOTE — Patient Instructions (Signed)

## 2014-03-19 ENCOUNTER — Ambulatory Visit (INDEPENDENT_AMBULATORY_CARE_PROVIDER_SITE_OTHER): Payer: Medicaid Other | Admitting: Adult Health

## 2014-03-19 ENCOUNTER — Encounter: Payer: Self-pay | Admitting: Adult Health

## 2014-03-19 VITALS — BP 132/90 | Ht 67.0 in | Wt 178.0 lb

## 2014-03-19 DIAGNOSIS — N951 Menopausal and female climacteric states: Secondary | ICD-10-CM

## 2014-03-19 DIAGNOSIS — K589 Irritable bowel syndrome without diarrhea: Secondary | ICD-10-CM

## 2014-03-19 DIAGNOSIS — I10 Essential (primary) hypertension: Secondary | ICD-10-CM

## 2014-03-19 HISTORY — DX: Irritable bowel syndrome without diarrhea: K58.9

## 2014-03-19 LAB — COMPREHENSIVE METABOLIC PANEL
ALBUMIN: 3.7 g/dL (ref 3.5–5.2)
ALT: 16 U/L (ref 0–35)
AST: 17 U/L (ref 0–37)
Alkaline Phosphatase: 65 U/L (ref 39–117)
BUN: 7 mg/dL (ref 6–23)
CALCIUM: 8.7 mg/dL (ref 8.4–10.5)
CHLORIDE: 104 meq/L (ref 96–112)
CO2: 25 mEq/L (ref 19–32)
Creat: 0.68 mg/dL (ref 0.50–1.10)
Glucose, Bld: 93 mg/dL (ref 70–99)
POTASSIUM: 4.7 meq/L (ref 3.5–5.3)
SODIUM: 137 meq/L (ref 135–145)
Total Bilirubin: 0.2 mg/dL (ref 0.2–1.2)
Total Protein: 5.8 g/dL — ABNORMAL LOW (ref 6.0–8.3)

## 2014-03-19 LAB — CBC
HCT: 38.6 % (ref 36.0–46.0)
Hemoglobin: 12.8 g/dL (ref 12.0–15.0)
MCH: 30.6 pg (ref 26.0–34.0)
MCHC: 33.2 g/dL (ref 30.0–36.0)
MCV: 92.3 fL (ref 78.0–100.0)
PLATELETS: 283 10*3/uL (ref 150–400)
RBC: 4.18 MIL/uL (ref 3.87–5.11)
RDW: 13.7 % (ref 11.5–15.5)
WBC: 7.9 10*3/uL (ref 4.0–10.5)

## 2014-03-19 LAB — LIPID PANEL
CHOLESTEROL: 170 mg/dL (ref 0–200)
HDL: 53 mg/dL (ref >39)
LDL CALC: 78 mg/dL (ref 0–99)
Total CHOL/HDL Ratio: 3.2 Ratio
Triglycerides: 193 mg/dL — ABNORMAL HIGH (ref <150)
VLDL: 39 mg/dL (ref 0–40)

## 2014-03-19 MED ORDER — HYDROCHLOROTHIAZIDE 12.5 MG PO CAPS: 12.5000 mg | ORAL_CAPSULE | Freq: Every day | ORAL | Status: DC

## 2014-03-19 MED ORDER — DICYCLOMINE HCL 20 MG PO TABS: 20.0000 mg | ORAL_TABLET | Freq: Three times a day (TID) | ORAL | Status: DC

## 2014-03-19 NOTE — Progress Notes (Signed)
Subjective:     Patient ID: Stacey Booth, female   DOB: [birth year], 38 y.o.   MRN: [id number]  HPI Stacey Booth is a 38 year old white female back for BP check and review of hot flashes and vaginal dryness. The vaginal dryness has corrected itself, still with flashes, complains of IBS like symptoms alternating constipation and diarrhea with pain in LLQ at times.Has had stomach problems for years she says.She saw her psychiatrist yesterday.BP was elevated yesterday.  Review of Systems See HPI Reviewed past medical,surgical, social and family history. Reviewed medications and allergies.     Objective:   Physical Exam BP 132/90  Ht 5\' 7"  (1.702 m)  Wt 178 lb (80.74 kg)  BMI 27.87 kg/m2  LMP 11/02/2011   talk only, see HPI, discussed adding another med for BP and referring to GI for IBS and pt agrees  Assessment:     Hypertension IBS  Hot flashes    Plan:     Refer to Dr Jena Gauss Rx bentyl 20 qid #60 with 1 refill Rx microzide 12.5 mg #30 1 daily with 11 refills Follow up in 4 weeks for BP check Check CBC,CMP,TSH and lipids Review handout on IBS

## 2014-03-19 NOTE — Patient Instructions (Signed)
Irritable Bowel Syndrome Irritable Bowel Syndrome (IBS) is caused by a disturbance of normal bowel function. Other terms used are spastic colon, mucous colitis, and irritable colon. It does not require surgery, nor does it lead to cancer. There is no cure for IBS. But with proper diet, stress reduction, and medication, you will find that your problems (symptoms) will gradually disappear or improve. IBS is a common digestive disorder. It usually appears in late adolescence or early adulthood. Women develop it twice as often as men. CAUSES  After food has been digested and absorbed in the small intestine, waste material is moved into the colon (large intestine). In the colon, water and salts are absorbed from the undigested products coming from the small intestine. The remaining residue, or fecal material, is held for elimination. Under normal circumstances, gentle, rhythmic contractions on the bowel walls push the fecal material along the colon towards the rectum. In IBS, however, these contractions are irregular and poorly coordinated. The fecal material is either retained too long, resulting in constipation, or expelled too soon, producing diarrhea. SYMPTOMS  The most common symptom of IBS is pain. It is typically in the lower left side of the belly (abdomen). But it may occur anywhere in the abdomen. It can be felt as heartburn, backache, or even as a dull pain in the arms or shoulders. The pain comes from excessive bowel-muscle spasms and from the buildup of gas and fecal material in the colon. This pain:  Can range from sharp belly (abdominal) cramps to a dull, continuous ache.  Usually worsens soon after eating.  Is typically relieved by having a bowel movement or passing gas. Abdominal pain is usually accompanied by constipation. But it may also produce diarrhea. The diarrhea typically occurs right after a meal or upon arising in the morning. The stools are typically soft and watery. They are often  flecked with secretions (mucus). Other symptoms of IBS include:  Bloating.  Loss of appetite.  Heartburn.  Feeling sick to your stomach (nausea).  Belching  Vomiting  Gas. IBS may also cause a number of symptoms that are unrelated to the digestive system:  Fatigue.  Headaches.  Anxiety  Shortness of breath  Difficulty in concentrating.  Dizziness. These symptoms tend to come and go. DIAGNOSIS  The symptoms of IBS closely mimic the symptoms of other, more serious digestive disorders. So your caregiver may wish to perform a variety of additional tests to exclude these disorders. He/she wants to be certain of learning what is wrong (diagnosis). The nature and purpose of each test will be explained to you. TREATMENT A number of medications are available to help correct bowel function and/or relieve bowel spasms and abdominal pain. Among the drugs available are:  Mild, non-irritating laxatives for severe constipation and to help restore normal bowel habits.  Specific anti-diarrheal medications to treat severe or prolonged diarrhea.  Anti-spasmodic agents to relieve intestinal cramps.  Your caregiver may also decide to treat you with a mild tranquilizer or sedative during unusually stressful periods in your life. The important thing to remember is that if any drug is prescribed for you, make sure that you take it exactly as directed. Make sure that your caregiver knows how well it worked for you. HOME CARE INSTRUCTIONS   Avoid foods that are high in fat or oils. Some examples JJO:ACZYS cream, butter, frankfurters, sausage, and other fatty meats.  Avoid foods that have a laxative effect, such as fruit, fruit juice, and dairy products.  Cut out  carbonated drinks, chewing gum, and "gassy" foods, such as beans and cabbage. This may help relieve bloating and belching.  Bran taken with plenty of liquids may help relieve constipation.  Keep track of what foods seem to trigger  your symptoms.  Avoid emotionally charged situations or circumstances that produce anxiety.  Start or continue exercising.  Get plenty of rest and sleep. MAKE SURE YOU:   Understand these instructions.  Will watch your condition.  Will get help right away if you are not doing well or get worse. Document Released: 10/10/2005 Document Revised: 01/02/2012 Document Reviewed: 05/30/2008 [facility] Patient Information 2014 [place], [place]. Referred to Dr Jena Gauss Take bentyl and HCTZ follow up in 4 weeks

## 2014-03-20 ENCOUNTER — Telehealth: Payer: Self-pay | Admitting: Adult Health

## 2014-03-20 LAB — TSH: TSH: 0.537 u[IU]/mL (ref 0.350–4.500)

## 2014-03-20 NOTE — Telephone Encounter (Signed)
Pt aware of labs  

## 2014-03-24 ENCOUNTER — Encounter: Payer: Self-pay | Admitting: Internal Medicine

## 2014-04-09 ENCOUNTER — Other Ambulatory Visit: Payer: Self-pay | Admitting: Obstetrics & Gynecology

## 2014-04-14 ENCOUNTER — Other Ambulatory Visit: Payer: Self-pay | Admitting: *Deleted

## 2014-04-17 ENCOUNTER — Other Ambulatory Visit: Payer: Self-pay | Admitting: *Deleted

## 2014-04-17 ENCOUNTER — Ambulatory Visit (INDEPENDENT_AMBULATORY_CARE_PROVIDER_SITE_OTHER): Payer: Medicaid Other | Admitting: Adult Health

## 2014-04-17 ENCOUNTER — Telehealth: Payer: Self-pay | Admitting: Adult Health

## 2014-04-17 ENCOUNTER — Encounter: Payer: Self-pay | Admitting: Adult Health

## 2014-04-17 VITALS — BP 140/88 | Ht 67.0 in | Wt 170.0 lb

## 2014-04-17 DIAGNOSIS — I10 Essential (primary) hypertension: Secondary | ICD-10-CM

## 2014-04-17 MED ORDER — HYDROCHLOROTHIAZIDE 25 MG PO TABS: 25.0000 mg | ORAL_TABLET | Freq: Every day | ORAL | Status: DC

## 2014-04-17 NOTE — Telephone Encounter (Signed)
Left message did not increase propranolol just fluid pill will refill Remus Lofflerambien

## 2014-04-17 NOTE — Progress Notes (Signed)
Subjective:     Patient ID: Bernie CoveyRobin S Dement, female   DOB: 07/11/1976, 38 y.o.   MRN: [id number]  HPI [name] is a 38 year old white female in for BP check, which is still up but she has noticed less fluid retention.She is taking the bentyl and it helps but it makes her sleepy and she had to decrease number of times she takes, has appt in July with GI.  Review of Systems See HPI Reviewed past medical,surgical, social and family history. Reviewed medications and allergies.     Objective:   Physical Exam BP 140/88  Ht 5\' 7"  (1.702 m)  Wt 170 lb (77.111 kg)  BMI 26.62 kg/m2  LMP 11/02/2011   Skin warm and dry. Lungs: clear to ausculation bilaterally. Cardiovascular: regular rate and rhythm. Discussed will increase meds to see if BP better.  Assessment:     Hypertension    Plan:     Rx hydrodiuril 25 mg #30 1 daily with 11 refills Return in 6 weeks for follow up  Limit salt  Keep appt 7/13 with GI

## 2014-04-17 NOTE — Patient Instructions (Signed)
Take fluid pill daily  Limit salt Follow up in 6 weeks Hypertension Hypertension, commonly called high blood pressure, is when the force of blood pumping through your arteries is too strong. Your arteries are the blood vessels that carry blood from your heart throughout your body. A blood pressure reading consists of a higher number over a lower number, such as 110/72. The higher number (systolic) is the pressure inside your arteries when your heart pumps. The lower number (diastolic) is the pressure inside your arteries when your heart relaxes. Ideally you want your blood pressure below 120/80. Hypertension forces your heart to work harder to pump blood. Your arteries may become narrow or stiff. Having hypertension puts you at risk for heart disease, stroke, and other problems.  RISK FACTORS Some risk factors for high blood pressure are controllable. Others are not.  Risk factors you cannot control include:   Race. You may be at higher risk if you are African American.  Age. Risk increases with age.  Gender. Men are at higher risk than women before age 40 years. After age 40, women are at higher risk than men. Risk factors you can control include:  Not getting enough exercise or physical activity.  Being overweight.  Getting too much fat, sugar, calories, or salt in your diet.  Drinking too much alcohol. SIGNS AND SYMPTOMS Hypertension does not usually cause signs or symptoms. Extremely high blood pressure (hypertensive crisis) may cause headache, anxiety, shortness of breath, and nosebleed. DIAGNOSIS  To check if you have hypertension, your health care provider will measure your blood pressure while you are seated, with your arm held at the level of your heart. It should be measured at least twice using the same arm. Certain conditions can cause a difference in blood pressure between your right and left arms. A blood pressure reading that is higher than normal on one occasion does not  mean that you need treatment. If one blood pressure reading is high, ask your health care provider about having it checked again. TREATMENT  Treating high blood pressure includes making lifestyle changes and possibly taking medication. Living a healthy lifestyle can help lower high blood pressure. You may need to change some of your habits. Lifestyle changes may include:  Following the DASH diet. This diet is high in fruits, vegetables, and whole grains. It is low in salt, red meat, and added sugars.  Getting at least 2 1/2 hours of brisk physical activity every week.  Losing weight if necessary.  Not smoking.  Limiting alcoholic beverages.  Learning ways to reduce stress. If lifestyle changes are not enough to get your blood pressure under control, your health care provider may prescribe medicine. You may need to take more than one. Work closely with your health care provider to understand the risks and benefits. HOME CARE INSTRUCTIONS  Have your blood pressure rechecked as directed by your health care provider.   Only take medicine as directed by your health care provider. Follow the directions carefully. Blood pressure medicines must be taken as prescribed. The medicine does not work as well when you skip doses. Skipping doses also puts you at risk for problems.   Do not smoke.   Monitor your blood pressure at home as directed by your health care provider. SEEK MEDICAL CARE IF:   You think you are having a reaction to medicines taken.  You have recurrent headaches or feel dizzy.  You have swelling in your ankles.  You have trouble with your vision.  SEEK IMMEDIATE MEDICAL CARE IF:  You develop a severe headache or confusion.  You have unusual weakness, numbness, or feel faint.  You have severe chest or abdominal pain.  You vomit repeatedly.  You have trouble breathing. MAKE SURE YOU:   Understand these instructions.  Will watch your condition.  Will get help  right away if you are not doing well or get worse. Document Released: 10/10/2005 Document Revised: 10/15/2013 Document Reviewed: 08/02/2013 [facility] Patient Information 2015 [place], [place]. This information is not intended to replace advice given to you by your health care provider. Make sure you discuss any questions you have with your health care provider.

## 2014-04-17 NOTE — Telephone Encounter (Signed)
Spoke with pt. Pt thought you increased the Propranolol to 2 in the am. Pt states she will run out early, and her pharmacy didn't get a refill for that med. Please advise. Thanks!! JSY

## 2014-04-22 ENCOUNTER — Telehealth: Payer: Self-pay | Admitting: *Deleted

## 2014-04-22 ENCOUNTER — Telehealth: Payer: Self-pay | Admitting: Obstetrics & Gynecology

## 2014-04-22 NOTE — Telephone Encounter (Signed)
Duplicate message. 

## 2014-04-22 NOTE — Telephone Encounter (Signed)
Pt informed Medicaid will not cover Ambien #30 but would cover #15 so called Ambien # 15 to [facility], [place]. Pt verbalized understanding.

## 2014-04-29 ENCOUNTER — Ambulatory Visit: Payer: Medicaid Other | Admitting: Gastroenterology

## 2014-05-05 ENCOUNTER — Ambulatory Visit: Payer: Medicaid Other | Admitting: Gastroenterology

## 2014-05-05 ENCOUNTER — Encounter: Payer: Self-pay | Admitting: Gastroenterology

## 2014-05-05 ENCOUNTER — Telehealth: Payer: Self-pay | Admitting: Gastroenterology

## 2014-05-05 NOTE — Telephone Encounter (Signed)
Mailed letter °

## 2014-05-05 NOTE — Telephone Encounter (Signed)
Pt was a no show

## 2014-05-05 NOTE — Telephone Encounter (Signed)
Please send letter.

## 2014-05-12 ENCOUNTER — Other Ambulatory Visit: Payer: Self-pay | Admitting: Obstetrics & Gynecology

## 2014-05-13 ENCOUNTER — Other Ambulatory Visit: Payer: Self-pay | Admitting: Obstetrics & Gynecology

## 2014-05-29 ENCOUNTER — Ambulatory Visit: Payer: Medicaid Other | Admitting: Adult Health

## 2014-06-02 ENCOUNTER — Encounter: Payer: Self-pay | Admitting: Adult Health

## 2014-06-02 ENCOUNTER — Ambulatory Visit (INDEPENDENT_AMBULATORY_CARE_PROVIDER_SITE_OTHER): Payer: Medicaid Other | Admitting: Adult Health

## 2014-06-02 VITALS — BP 120/80 | Temp 98.3°F | Ht 67.0 in | Wt 157.0 lb

## 2014-06-02 DIAGNOSIS — Z87442 Personal history of urinary calculi: Secondary | ICD-10-CM

## 2014-06-02 DIAGNOSIS — M546 Pain in thoracic spine: Secondary | ICD-10-CM

## 2014-06-02 DIAGNOSIS — I10 Essential (primary) hypertension: Secondary | ICD-10-CM

## 2014-06-02 DIAGNOSIS — M549 Dorsalgia, unspecified: Secondary | ICD-10-CM | POA: Insufficient documentation

## 2014-06-02 DIAGNOSIS — R319 Hematuria, unspecified: Secondary | ICD-10-CM

## 2014-06-02 DIAGNOSIS — Z1389 Encounter for screening for other disorder: Secondary | ICD-10-CM

## 2014-06-02 HISTORY — DX: Hematuria, unspecified: R31.9

## 2014-06-02 HISTORY — DX: Personal history of urinary calculi: Z87.442

## 2014-06-02 LAB — POCT URINALYSIS DIPSTICK
Leukocytes, UA: NEGATIVE
NITRITE UA: NEGATIVE

## 2014-06-02 MED ORDER — HYDROCODONE-ACETAMINOPHEN 5-325 MG PO TABS: 1.0000 | ORAL_TABLET | Freq: Four times a day (QID) | ORAL | Status: DC | PRN

## 2014-06-02 MED ORDER — NYSTATIN-TRIAMCINOLONE 100000-0.1 UNIT/GM-% EX OINT: 1.0000 "application " | TOPICAL_OINTMENT | Freq: Two times a day (BID) | CUTANEOUS | Status: DC

## 2014-06-02 MED ORDER — PROMETHAZINE HCL 25 MG PO TABS: 25.0000 mg | ORAL_TABLET | Freq: Four times a day (QID) | ORAL | Status: DC | PRN

## 2014-06-02 MED ORDER — SULFAMETHOXAZOLE-TMP DS 800-160 MG PO TABS: 1.0000 | ORAL_TABLET | Freq: Two times a day (BID) | ORAL | Status: DC

## 2014-06-02 NOTE — Progress Notes (Signed)
Subjective:     Patient ID: Stacey Booth, female   DOB: [birth year], 38 y.o.   MRN: [id number]  HPI Stacey Booth is a 38 year old white female in for BP check and is complaining of pain right back and bladder area,for 4-5 days, has history of kidney stones.  Review of Systems See HPI Reviewed past medical,surgical, social and family history. Reviewed medications and allergies.     Objective:   Physical Exam BP 120/80  Temp(Src) 98.3 F (36.8 C)  Ht 5\' 7"  (1.702 m)  Wt 157 lb (71.215 kg)  BMI 24.58 kg/m2  LMP 11/02/2011 urine 2+ blood trace protein, Skin warm and dry.Pelvic: external genitalia is normal in appearance, except for razor burn, vagina has good color, moisture and rugae, has small tear near clitoris, the cervix and uterus are absent, no masses felt, some tenderness over bladder area, No CVAT.Abdomin soft, no masses non tender, No HSM. Discussed that BP great, and this may be another kidney stone.    Assessment:    Hyertension, controlled Hematuria History of kidney stones Right sided back pain    Plan:    Review handout on hematuria and kidney stones If pain increases go to ER Push fluids about 8 ozs every hour Rx septra ds 1 bid x 7 days #14 no refills Rx mycolog cream prn to affected area 2-3 x, no refills Rx phenergan 25 mg #30 1 every 6 hours prn with 1 refill Rx norco 5mg -325 mg # 30 1 every 6 hours prn no refills   UA C&S sent Follow up in 3 months

## 2014-06-02 NOTE — Patient Instructions (Signed)
Kidney Stones Kidney stones (urolithiasis) are deposits that form inside your kidneys. The intense pain is caused by the stone moving through the urinary tract. When the stone moves, the ureter goes into spasm around the stone. The stone is usually passed in the urine.  CAUSES   A disorder that makes certain neck glands produce too much parathyroid hormone (primary hyperparathyroidism).  A buildup of uric acid crystals, similar to gout in your joints.  Narrowing (stricture) of the ureter.  A kidney obstruction present at birth (congenital obstruction).  Previous surgery on the kidney or ureters.  Numerous kidney infections. SYMPTOMS   Feeling sick to your stomach (nauseous).  Throwing up (vomiting).  Blood in the urine (hematuria).  Pain that usually spreads (radiates) to the groin.  Frequency or urgency of urination. DIAGNOSIS   Taking a history and physical exam.  Blood or urine tests.  CT scan.  Occasionally, an examination of the inside of the urinary bladder (cystoscopy) is performed. TREATMENT   Observation.  Increasing your fluid intake.  Extracorporeal shock wave lithotripsy--This is a noninvasive procedure that uses shock waves to break up kidney stones.  Surgery may be needed if you have severe pain or persistent obstruction. There are various surgical procedures. Most of the procedures are performed with the use of small instruments. Only small incisions are needed to accommodate these instruments, so recovery time is minimized. The size, location, and chemical composition are all important variables that will determine the proper choice of action for you. Talk to your health care provider to better understand your situation so that you will minimize the risk of injury to yourself and your kidney.  HOME CARE INSTRUCTIONS   Drink enough water and fluids to keep your urine clear or pale yellow. This will help you to pass the stone or stone fragments.  Strain  all urine through the provided strainer. Keep all particulate matter and stones for your health care provider to see. The stone causing the pain may be as small as a grain of salt. It is very important to use the strainer each and every time you pass your urine. The collection of your stone will allow your health care provider to analyze it and verify that a stone has actually passed. The stone analysis will often identify what you can do to reduce the incidence of recurrences.  Only take over-the-counter or prescription medicines for pain, discomfort, or fever as directed by your health care provider.  Make a follow-up appointment with your health care provider as directed.  Get follow-up X-rays if required. The absence of pain does not always mean that the stone has passed. It may have only stopped moving. If the urine remains completely obstructed, it can cause loss of kidney function or even complete destruction of the kidney. It is your responsibility to make sure X-rays and follow-ups are completed. Ultrasounds of the kidney can show blockages and the status of the kidney. Ultrasounds are not associated with any radiation and can be performed easily in a matter of minutes. SEEK MEDICAL CARE IF:  You experience pain that is progressive and unresponsive to any pain medicine you have been prescribed. SEEK IMMEDIATE MEDICAL CARE IF:   Pain cannot be controlled with the prescribed medicine.  You have a fever or shaking chills.  The severity or intensity of pain increases over 18 hours and is not relieved by pain medicine.  You develop a new onset of abdominal pain.  You feel faint or pass out.  You are unable to urinate. MAKE SURE YOU:   Understand these instructions.  Will watch your condition.  Will get help right away if you are not doing well or get worse. Document Released: 10/10/2005 Document Revised: 06/12/2013 Document Reviewed: 03/13/2013 [facility] CenterExitCare Patient Information 2015  [place], [place]. This information is not intended to replace advice given to you by your health care provider. Make sure you discuss any questions you have with your health care provider. Hematuria Hematuria is blood in your urine. It can be caused by a bladder infection, kidney infection, prostate infection, kidney stone, or cancer of your urinary tract. Infections can usually be treated with medicine, and a kidney stone usually will pass through your urine. If neither of these is the cause of your hematuria, further workup to find out the reason may be needed. It is very important that you tell your health care provider about any blood you see in your urine, even if the blood stops without treatment or happens without causing pain. Blood in your urine that happens and then stops and then happens again can be a symptom of a very serious condition. Also, pain is not a symptom in the initial stages of many urinary cancers. HOME CARE INSTRUCTIONS   Drink lots of fluid, 3-4 quarts a day. If you have been diagnosed with an infection, cranberry juice is especially recommended, in addition to large amounts of water.  Avoid caffeine, tea, and carbonated beverages because they tend to irritate the bladder.  Avoid alcohol because it may irritate the prostate.  Take all medicines as directed by your health care provider.  If you were prescribed an antibiotic medicine, finish it all even if you start to feel better.  If you have been diagnosed with a kidney stone, follow your health care provider's instructions regarding straining your urine to catch the stone.  Empty your bladder often. Avoid holding urine for long periods of time.  After a bowel movement, women should cleanse front to back. Use each tissue only once.  Empty your bladder before and after sexual intercourse if you are a female. SEEK MEDICAL CARE IF:  You develop back pain.  You have a fever.  You have a feeling of sickness in your  stomach (nausea) or vomiting.  Your symptoms are not better in 3 days. Return sooner if you are getting worse. SEEK IMMEDIATE MEDICAL CARE IF:   You develop severe vomiting and are unable to keep the medicine down.  You develop severe back or abdominal pain despite taking your medicines.  You begin passing a large amount of blood or clots in your urine.  You feel extremely weak or faint, or you pass out. MAKE SURE YOU:   Understand these instructions.  Will watch your condition.  Will get help right away if you are not doing well or get worse. Document Released: 10/10/2005 Document Revised: 02/24/2014 Document Reviewed: 06/10/2013 [facility]Care Patient Information 2015 [place], [place]. This information is not intended to replace advice given to you by your health care provider. Make sure you discuss any questions you have with your health care provider. Follow up in 3 months If pain increases go to ER

## 2014-06-03 ENCOUNTER — Telehealth: Payer: Self-pay | Admitting: *Deleted

## 2014-06-03 LAB — URINALYSIS
Glucose, UA: NEGATIVE mg/dL
Nitrite: NEGATIVE
PROTEIN: 30 mg/dL — AB
Specific Gravity, Urine: 1.028 (ref 1.005–1.030)
UROBILINOGEN UA: 1 mg/dL (ref 0.0–1.0)
pH: 6 (ref 5.0–8.0)

## 2014-06-03 LAB — URINE CULTURE
Colony Count: NO GROWTH
Organism ID, Bacteria: NO GROWTH

## 2014-06-03 NOTE — Telephone Encounter (Signed)
[facility] pharmacy called to verify order for nystatin cream verses mycolog cream. Informed Cyril MourningJennifer Griffin, NP out of office until tomorrow will route message.

## 2014-06-10 ENCOUNTER — Other Ambulatory Visit: Payer: Self-pay | Admitting: Adult Health

## 2014-06-10 ENCOUNTER — Encounter: Payer: Self-pay | Admitting: Adult Health

## 2014-06-11 NOTE — Telephone Encounter (Signed)
Called and left message for Dr Lawerance CruelJavaids office to call her

## 2014-06-16 ENCOUNTER — Other Ambulatory Visit: Payer: Self-pay | Admitting: Obstetrics & Gynecology

## 2014-06-17 ENCOUNTER — Other Ambulatory Visit: Payer: Self-pay | Admitting: Adult Health

## 2014-06-17 DIAGNOSIS — N200 Calculus of kidney: Secondary | ICD-10-CM

## 2014-06-19 ENCOUNTER — Ambulatory Visit (INDEPENDENT_AMBULATORY_CARE_PROVIDER_SITE_OTHER): Payer: Medicaid Other | Admitting: Gastroenterology

## 2014-06-19 ENCOUNTER — Encounter: Payer: Self-pay | Admitting: Gastroenterology

## 2014-06-19 VITALS — BP 123/83 | HR 88 | Temp 97.6°F | Ht 67.0 in | Wt 159.4 lb

## 2014-06-19 DIAGNOSIS — R1032 Left lower quadrant pain: Secondary | ICD-10-CM

## 2014-06-19 DIAGNOSIS — R1013 Epigastric pain: Secondary | ICD-10-CM

## 2014-06-19 DIAGNOSIS — R197 Diarrhea, unspecified: Secondary | ICD-10-CM

## 2014-06-19 DIAGNOSIS — R110 Nausea: Secondary | ICD-10-CM

## 2014-06-19 DIAGNOSIS — K625 Hemorrhage of anus and rectum: Secondary | ICD-10-CM

## 2014-06-19 MED ORDER — CILIDINIUM-CHLORDIAZEPOXIDE 2.5-5 MG PO CAPS: 1.0000 | ORAL_CAPSULE | Freq: Four times a day (QID) | ORAL | Status: DC | PRN

## 2014-06-19 MED ORDER — PEG 3350-KCL-NA BICARB-NACL 420 G PO SOLR: 4000.0000 mL | ORAL | Status: DC

## 2014-06-19 NOTE — Patient Instructions (Signed)
1. Please have your blood work done today or tomorrow. 2. Start Librax for abdominal pain and diarrhea. You may take up to four times daily as needed preferably before meals and at bedtime. 3. Colonoscopy and upper endoscopy with Dr. Jena Gauss. See separate instructions.

## 2014-06-19 NOTE — Assessment & Plan Note (Signed)
38 y/o female with numerous GI symptoms including chronic postprandial abdominal pain, diarrhea, constant nausea, recent epigastric pain. Minimal improvement with Bentyl. Patient avoids solid food due to severe symptoms. Complains of nocturnal symptoms at this time. Need to rule out celiac, IBD. Cannot exclude IBS. Offered EGD/colonoscopy.  I have discussed the risks, alternatives, benefits with regards to but not limited to the risk of reaction to medication, bleeding, infection, perforation and the patient is agreeable to proceed. Written consent to be obtained.  Phenergan  IV 30 minutes before procedure for anti-emetic purposes and augment conscious sedation.   Check Celiac screen.  Librax QID prn.

## 2014-06-19 NOTE — Progress Notes (Signed)
Primary Care Physician:  PROVIDER NOT IN SYSTEM  Primary Gastroenterologist:  Michael Rourk, MD   Chief Complaint  Patient presents with  . Irritable Bowel Syndrome  . Nausea    very sick when having BM    HPI:  Stacey Booth is a 37 y.o. female here for further evaluation of abdominal pain, nausea, diarrhea at the request of Jennifer Griffen, NP.   Always had stomach problems. "gastritis attacks". GB out 7 years ago, if eat greasy foods then diarrhea. Over the last one year everything worse. Postprandial nausea, loose stools. Drinks mostly fluids and may eat more breads cause tolerates it. Now with elevated trigyclerides, has to stay away from the "white" stuff. Stays thirsty. Avoids going out to eat. Constantly stays nauseated. Waits until night time to eat solid food due to unpredictable GI symptoms. Wakes up during the night with terrible nausea, cramps. When goes to bathroom, stool looks normal at first with mucous. Used to be mostly left side pain but now whole lower abdomen. Will have to go back to bathroom several times and eventually watery stools. Then may skip couple of days. Drinking Boost. Feels tired. Blood on tissue when rectal irritation couple of times. No melena. She no longer eats during the day or she will have abd pain and diarrhea all day long. Denies relief of abdominal pain with BMs.    For years been told IBS. Bentyl helped some of the abdominal pain after meals and helped LLQ. No heartburn. Lactose really bothers stomach. Takes advil every day. Nausea every day regardless of BMs. Developed epig pain few three weeks. Takes Klonopin only on occasion.   Current Outpatient Prescriptions  Medication Sig Dispense Refill  . acetaminophen (TYLENOL) 500 MG tablet Take 1,000 mg by mouth as needed.      . amphetamine-dextroamphetamine (ADDERALL XR) 10 MG 24 hr capsule Take 10 mg by mouth daily.      . clonazePAM (KLONOPIN) 1 MG tablet Take 1 mg by mouth 2 (two) times daily as  needed for anxiety.      . estradiol (ESTRACE) 1 MG tablet TAKE 1 TABLET BY MOUTH TWICE DAILY  60 tablet  3  . hydrochlorothiazide (HYDRODIURIL) 25 MG tablet Take 1 tablet (25 mg total) by mouth daily.  30 tablet  11  . ibuprofen (ADVIL,MOTRIN) 200 MG tablet Take 600-800 mg by mouth every 6 (six) hours as needed. For pain      . promethazine (PHENERGAN) 25 MG tablet Take 1 tablet (25 mg total) by mouth every 6 (six) hours as needed for nausea or vomiting.  30 tablet  1  . propranolol (INDERAL) 20 MG tablet Take 1 daily in am  30 tablet  6  . zolpidem (AMBIEN) 10 MG tablet TAKE 1 TABLET BY MOUTH EVERY NIGHT AT BEDTIME AS NEEDED FOR SLEEP  30 tablet  0  .       . promethazine (PHENERGAN) 25 MG tablet Take 1 tablet (25 mg total) by mouth every 6 (six) hours as needed for nausea.  8 tablet  0   No current facility-administered medications for this visit.    Allergies as of 06/19/2014 - Review Complete 06/19/2014  Allergen Reaction Noted  . Penicillins Hives 03/23/2011    Past Medical History  Diagnosis Date  . PONV (postoperative nausea and vomiting)   . Renal disorder     kidney stones  . Arthritis   . ADD (attention deficit disorder)   . PTSD (post-traumatic stress disorder)   .   Anxiety   . Depression   . Hypertension   . Vaginal dryness 02/18/2014  . IBS (irritable bowel syndrome) 03/19/2014  . Hematuria 06/02/2014  . History of kidney stones 06/02/2014    Past Surgical History  Procedure Laterality Date  . Cystoscopy  5-6 months ago    [facility]  . Cholecystectomy    . Tubal ligation    . Tonsillectomy    . Back surgery      ruptured disc-laparoscopic repair  . Vaginal hysterectomy  11/30/2011    Procedure: HYSTERECTOMY VAGINAL;  Surgeon: Luther H Eure, MD;  Location: [facility];  Service: Gynecology;  Laterality: N/A;  transvaginal hysterectomy with right salpingo-oophorectomy  . Salpingoophorectomy  11/30/2011    Procedure: SALPINGO OOPHERECTOMY;  Surgeon: Luther H Eure, MD;   Location: [facility];  Service: Gynecology;  Laterality: Right;    Family History  Problem Relation Age of Onset  . Anesthesia problems Neg Hx   . Hypotension Neg Hx   . Malignant hyperthermia Neg Hx   . Pseudochol deficiency Neg Hx   . Cancer Father   . Diabetes Father   . Heart attack Father   . Stroke Father   . Diabetes Maternal Grandmother   . Thyroid disease Mother   . Goiter Mother   . Schizophrenia Sister   . Bipolar disorder Sister   . ADD / ADHD Son   . Asperger's syndrome Son   . Anxiety disorder Son   . Autism spectrum disorder Son     History   Social History  . Marital Status: Married    Spouse Name: N/A    Number of Children: N/A  . Years of Education: N/A   Occupational History  . Not on file.   Social History Main Topics  . Smoking status: Current Every Day Smoker -- 0.50 packs/day for 15 years    Types: Cigarettes  . Smokeless tobacco: Never Used  . Alcohol Use: No  . Drug Use: No  . Sexual Activity: Yes    Birth Control/ Protection: Surgical   Other Topics Concern  . Not on file   Social History Narrative  . No narrative on file      ROS:  General: Negative for anorexia, weight loss, fever, chills. Eyes: Negative for vision changes.  ENT: Negative for hoarseness, difficulty swallowing , nasal congestion. CV: Negative for chest pain, angina, palpitations, dyspnea on exertion, peripheral edema.  Respiratory: Negative for dyspnea at rest, dyspnea on exertion, cough, sputum, wheezing.  GI: See history of present illness. GU:  Negative for dysuria, hematuria, urinary incontinence, urinary frequency, nocturnal urination. Has h/o kidney stones and microscopic hematuria. Currently seeing Dr. Wrenn. MS: Negative for joint pain. Intermittent neck and low back pain.  Derm: Negative for rash or itching.  Neuro: Negative for weakness, abnormal sensation, seizure, frequent headaches, memory loss, confusion.  Psych: Negative for anxiety, depression,  suicidal ideation, hallucinations.  Endo: Negative for unusual weight change.  Heme: Negative for bruising or bleeding. Allergy: Negative for rash or hives.    Physical Examination:  BP 123/83  Pulse 88  Temp(Src) 97.6 F (36.4 C) (Oral)  Ht 5' 7" (1.702 m)  Wt 159 lb 6.4 oz (72.303 kg)  BMI 24.96 kg/m2  LMP 11/02/2011   General: Well-nourished, well-developed in no acute distress. Tearful. Head: Normocephalic, atraumatic.   Eyes: Conjunctiva pink, no icterus. Mouth: Oropharyngeal mucosa moist and pink , no lesions erythema or exudate. Neck: Supple without thyromegaly, masses, or lymphadenopathy.  Lungs: Clear to   auscultation bilaterally.  Heart: Regular rate and rhythm, no murmurs rubs or gallops.  Abdomen: Bowel sounds are normal, LLQ tenderness, nondistended, no hepatosplenomegaly or masses, no abdominal bruits or    hernia , no rebound or guarding.   Rectal: deferred Extremities: No lower extremity edema. No clubbing or deformities.  Neuro: Alert and oriented x 4 , grossly normal neurologically.  Skin: Warm and dry, no rash or jaundice.   Psych: Alert and cooperative, normal mood and affect.  Labs: Lab Results  Component Value Date   WBC 7.9 03/19/2014   HGB 12.8 03/19/2014   HCT 38.6 03/19/2014   MCV 92.3 03/19/2014   PLT 283 03/19/2014   Lab Results  Component Value Date   ALT 16 03/19/2014   AST 17 03/19/2014   ALKPHOS 65 03/19/2014   BILITOT 0.2 03/19/2014   Lab Results  Component Value Date   CREATININE 0.68 03/19/2014   BUN 7 03/19/2014   NA 137 03/19/2014   K 4.7 03/19/2014   CL 104 03/19/2014   CO2 25 03/19/2014     Imaging Studies: No results found.    

## 2014-06-20 ENCOUNTER — Other Ambulatory Visit: Payer: Self-pay | Admitting: Obstetrics & Gynecology

## 2014-06-20 LAB — TISSUE TRANSGLUTAMINASE, IGA: Tissue Transglutaminase Ab, IgA: 2.1 U/mL (ref <20)

## 2014-06-20 LAB — IGA: IgA: 142 mg/dL (ref 69–380)

## 2014-06-20 NOTE — Progress Notes (Signed)
Quick Note:  Please let pt know celiac screen negative. Procedures as planned. ______

## 2014-06-22 ENCOUNTER — Encounter (HOSPITAL_COMMUNITY): Payer: Self-pay | Admitting: Emergency Medicine

## 2014-06-22 ENCOUNTER — Emergency Department (HOSPITAL_COMMUNITY)
Admission: EM | Admit: 2014-06-22 | Discharge: 2014-06-22 | Disposition: A | Discharge status: Home / Self Care | Payer: Medicaid Other | Attending: Emergency Medicine | Admitting: Emergency Medicine

## 2014-06-22 DIAGNOSIS — R109 Unspecified abdominal pain: Secondary | ICD-10-CM | POA: Insufficient documentation

## 2014-06-22 DIAGNOSIS — K5900 Constipation, unspecified: Secondary | ICD-10-CM | POA: Diagnosis not present

## 2014-06-22 DIAGNOSIS — Z9889 Other specified postprocedural states: Secondary | ICD-10-CM | POA: Diagnosis not present

## 2014-06-22 DIAGNOSIS — Z880 Allergy status to penicillin: Secondary | ICD-10-CM | POA: Insufficient documentation

## 2014-06-22 DIAGNOSIS — I10 Essential (primary) hypertension: Secondary | ICD-10-CM | POA: Insufficient documentation

## 2014-06-22 DIAGNOSIS — B9789 Other viral agents as the cause of diseases classified elsewhere: Secondary | ICD-10-CM | POA: Diagnosis not present

## 2014-06-22 DIAGNOSIS — Z9851 Tubal ligation status: Secondary | ICD-10-CM | POA: Insufficient documentation

## 2014-06-22 DIAGNOSIS — R319 Hematuria, unspecified: Secondary | ICD-10-CM | POA: Insufficient documentation

## 2014-06-22 DIAGNOSIS — Z87442 Personal history of urinary calculi: Secondary | ICD-10-CM | POA: Diagnosis not present

## 2014-06-22 DIAGNOSIS — Z79899 Other long term (current) drug therapy: Secondary | ICD-10-CM | POA: Diagnosis not present

## 2014-06-22 DIAGNOSIS — Z9089 Acquired absence of other organs: Secondary | ICD-10-CM | POA: Diagnosis not present

## 2014-06-22 DIAGNOSIS — M545 Low back pain, unspecified: Secondary | ICD-10-CM | POA: Insufficient documentation

## 2014-06-22 DIAGNOSIS — R631 Polydipsia: Secondary | ICD-10-CM | POA: Diagnosis not present

## 2014-06-22 DIAGNOSIS — B349 Viral infection, unspecified: Secondary | ICD-10-CM

## 2014-06-22 DIAGNOSIS — M129 Arthropathy, unspecified: Secondary | ICD-10-CM | POA: Insufficient documentation

## 2014-06-22 DIAGNOSIS — F988 Other specified behavioral and emotional disorders with onset usually occurring in childhood and adolescence: Secondary | ICD-10-CM | POA: Insufficient documentation

## 2014-06-22 DIAGNOSIS — F17200 Nicotine dependence, unspecified, uncomplicated: Secondary | ICD-10-CM | POA: Insufficient documentation

## 2014-06-22 DIAGNOSIS — F411 Generalized anxiety disorder: Secondary | ICD-10-CM | POA: Insufficient documentation

## 2014-06-22 LAB — CBC WITH DIFFERENTIAL/PLATELET
Basophils Absolute: 0 10*3/uL (ref 0.0–0.1)
Basophils Relative: 0 % (ref 0–1)
Eosinophils Absolute: 0.3 10*3/uL (ref 0.0–0.7)
Eosinophils Relative: 3 % (ref 0–5)
HCT: 43.4 % (ref 36.0–46.0)
HEMOGLOBIN: 15.2 g/dL — AB (ref 12.0–15.0)
LYMPHS ABS: 2.3 10*3/uL (ref 0.7–4.0)
Lymphocytes Relative: 23 % (ref 12–46)
MCH: 32.3 pg (ref 26.0–34.0)
MCHC: 35 g/dL (ref 30.0–36.0)
MCV: 92.1 fL (ref 78.0–100.0)
MONOS PCT: 6 % (ref 3–12)
Monocytes Absolute: 0.6 10*3/uL (ref 0.1–1.0)
NEUTROS ABS: 6.8 10*3/uL (ref 1.7–7.7)
NEUTROS PCT: 68 % (ref 43–77)
Platelets: 271 10*3/uL (ref 150–400)
RBC: 4.71 MIL/uL (ref 3.87–5.11)
RDW: 12.7 % (ref 11.5–15.5)
WBC: 10.1 10*3/uL (ref 4.0–10.5)

## 2014-06-22 LAB — URINALYSIS, ROUTINE W REFLEX MICROSCOPIC
BILIRUBIN URINE: NEGATIVE
Glucose, UA: NEGATIVE mg/dL
HGB URINE DIPSTICK: NEGATIVE
KETONES UR: NEGATIVE mg/dL
Leukocytes, UA: NEGATIVE
NITRITE: NEGATIVE
Protein, ur: NEGATIVE mg/dL
SPECIFIC GRAVITY, URINE: 1.01 (ref 1.005–1.030)
Urobilinogen, UA: 0.2 mg/dL (ref 0.0–1.0)
pH: 7 (ref 5.0–8.0)

## 2014-06-22 LAB — COMPREHENSIVE METABOLIC PANEL
ALK PHOS: 80 U/L (ref 39–117)
ALT: 13 U/L (ref 0–35)
ANION GAP: 13 (ref 5–15)
AST: 17 U/L (ref 0–37)
Albumin: 3.8 g/dL (ref 3.5–5.2)
BUN: 6 mg/dL (ref 6–23)
CHLORIDE: 100 meq/L (ref 96–112)
CO2: 29 mEq/L (ref 19–32)
Calcium: 9.9 mg/dL (ref 8.4–10.5)
Creatinine, Ser: 0.85 mg/dL (ref 0.50–1.10)
GFR calc Af Amer: >90 mL/min (ref >90)
GFR calc non Af Amer: 85 mL/min — ABNORMAL LOW (ref >90)
GLUCOSE: 88 mg/dL (ref 70–99)
POTASSIUM: 4.5 meq/L (ref 3.7–5.3)
SODIUM: 142 meq/L (ref 137–147)
Total Protein: 7.4 g/dL (ref 6.0–8.3)

## 2014-06-22 MED ORDER — METHOCARBAMOL 500 MG PO TABS: 500.0000 mg | ORAL_TABLET | Freq: Three times a day (TID) | ORAL | Status: DC

## 2014-06-22 MED ORDER — ACETAMINOPHEN-CODEINE #3 300-30 MG PO TABS: 1.0000 | ORAL_TABLET | Freq: Four times a day (QID) | ORAL | Status: DC | PRN

## 2014-06-22 MED ORDER — IBUPROFEN 800 MG PO TABS: 800.0000 mg | ORAL_TABLET | Freq: Three times a day (TID) | ORAL | Status: DC

## 2014-06-22 NOTE — ED Provider Notes (Signed)
CSN: [id number]     Arrival date & time 06/22/14  1237 History   First MD Initiated Contact with Patient 06/22/14 1504     Chief Complaint  Patient presents with  . Flank Pain     (Consider location/radiation/quality/duration/timing/severity/associated sxs/prior Treatment) HPI Comments: Patient is a 38 year old female who presents to the emergency department with right and left flank area pain. The patient states that for about 3 weeks she's been having pain in her right flank area. She now has pain in her left flank area. She states that a few weeks ago she was diagnosed with a" kidney stone by her GYN clinic should. She states that she did not have any imaging testing, but was noted to have a change in her urine and based on her history and examination it was felt she probably had kidney stones. It is of note that she has had problems with kidney stones in the past. The patient has not had any high fever, she has had nausea, but no hematuria.  The patient also complains of generally not feeling well and that this is been going on since August 10. She's had problems with nausea fever or, being tired with easy fatigue, and aching allover. She has tried Tylenol and ibuprofen but states the areas are not helping her. She request evaluation of these medical issues.  Patient is a 38 y.o. female presenting with flank pain. The history is provided by the patient.  Flank Pain This is a recurrent problem. Associated symptoms include fatigue, headaches and nausea. Pertinent negatives include no abdominal pain, arthralgias, chest pain, coughing, neck pain or sore throat.    Past Medical History  Diagnosis Date  . PONV (postoperative nausea and vomiting)   . Renal disorder     kidney stones  . Arthritis   . ADD (attention deficit disorder)   . PTSD (post-traumatic stress disorder)   . Anxiety   . Depression   . Hypertension   . Vaginal dryness 02/18/2014  . IBS (irritable bowel syndrome) 03/19/2014   . Hematuria 06/02/2014  . History of kidney stones 06/02/2014  . Endometriosis     s/p hysterectomy   Past Surgical History  Procedure Laterality Date  . Cystoscopy  5-6 months ago    [place]  . Cholecystectomy    . Tubal ligation    . Tonsillectomy    . Back surgery      ruptured disc-laparoscopic repair  . Vaginal hysterectomy  11/30/2011    Procedure: HYSTERECTOMY VAGINAL;  Surgeon: Lazaro Arms, MD;  Location: [facility];  Service: Gynecology;  Laterality: N/A;  transvaginal hysterectomy with right salpingo-oophorectomy  . Salpingoophorectomy  11/30/2011    Procedure: SALPINGO OOPHERECTOMY;  Surgeon: Lazaro Arms, MD;  Location: [facility];  Service: Gynecology;  Laterality: Right;  . Endometrial ablation     Family History  Problem Relation Age of Onset  . Anesthesia problems Neg Hx   . Hypotension Neg Hx   . Malignant hyperthermia Neg Hx   . Pseudochol deficiency Neg Hx   . Cancer Father   . Diabetes Father   . Heart attack Father   . Stroke Father   . Diabetes Maternal Grandmother   . Thyroid disease Mother   . Goiter Mother   . Schizophrenia Sister   . Bipolar disorder Sister   . ADD / ADHD Son   . Asperger's syndrome Son   . Anxiety disorder Son   . Autism spectrum disorder Son    History  Substance Use Topics  . Smoking status: Current Every Day Smoker -- 0.50 packs/day for 15 years    Types: Cigarettes  . Smokeless tobacco: Never Used  . Alcohol Use: No   OB History   Grav Para Term Preterm Abortions TAB SAB Ect Mult Living   Review of Systems  Constitutional: Positive for activity change and fatigue.       All ROS Neg except as noted in HPI  HENT: Negative for ear pain, sore throat, trouble swallowing and voice change.   Eyes: Negative for photophobia and discharge.  Respiratory: Negative for cough, shortness of breath and wheezing.   Cardiovascular: Negative for chest pain and palpitations.  Gastrointestinal: Positive for nausea and  constipation. Negative for abdominal pain, diarrhea and blood in stool.  Endocrine: Positive for polydipsia.  Genitourinary: Positive for hematuria and flank pain. Negative for dysuria, frequency, vaginal bleeding, vaginal discharge and vaginal pain.  Musculoskeletal: Positive for back pain. Negative for arthralgias and neck pain.  Skin: Negative.   Allergic/Immunologic: Negative.   Neurological: Positive for headaches. Negative for dizziness, seizures, syncope and speech difficulty.  Psychiatric/Behavioral: Negative for hallucinations and confusion.      Allergies  Naproxen and Penicillins  Home Medications   Prior to Admission medications   Medication Sig Start Date End Date Taking? Authorizing Provider  acetaminophen (TYLENOL) 500 MG tablet Take 1,000 mg by mouth every 6 (six) hours as needed for mild pain.    Yes Historical Provider, MD  amphetamine-dextroamphetamine (ADDERALL XR) 10 MG 24 hr capsule Take 10 mg by mouth daily.   Yes Historical Provider, MD  clonazePAM (KLONOPIN) 1 MG tablet Take 0.5 mg by mouth 2 (two) times daily as needed for anxiety.    Yes Historical Provider, MD  DiphenhydrAMINE HCl, Sleep, 50 MG CAPS Take 1 capsule by mouth at bedtime as needed (Sleep).   Yes Historical Provider, MD  estradiol (ESTRACE) 1 MG tablet Take 1 mg by mouth 2 (two) times daily.   Yes Historical Provider, MD  hydrochlorothiazide (HYDRODIURIL) 25 MG tablet Take 1 tablet (25 mg total) by mouth daily. 04/17/14  Yes Adline Potter, NP  ibuprofen (ADVIL,MOTRIN) 200 MG tablet Take 600-800 mg by mouth every 6 (six) hours as needed. For pain   Yes Historical Provider, MD  promethazine (PHENERGAN) 25 MG tablet Take 1 tablet (25 mg total) by mouth every 6 (six) hours as needed for nausea or vomiting. 06/02/14  Yes Adline Potter, NP  propranolol (INDERAL) 20 MG tablet Take 20 mg by mouth daily. Take 1 daily in am 02/18/14  Yes Adline Potter, NP  zolpidem (AMBIEN) 10 MG tablet Take 10 mg  by mouth at bedtime as needed for sleep.   Yes Historical Provider, MD  polyethylene glycol-electrolytes (TRILYTE) 420 G solution Take 4,000 mLs by mouth as directed. 06/19/14   Corbin Ade, MD   BP 124/83  Pulse 78  Temp(Src) 98.6 F (37 C) (Oral)  Resp 16  Ht  (1.702 m)  Wt 155 lb (70.308 kg)  BMI 24.27 kg/m2  SpO2 98%  LMP 11/02/2011 Physical Exam  Nursing note and vitals reviewed. Constitutional: She is oriented to person, place, and time. She appears well-developed and well-nourished.  Non-toxic appearance.  HENT:  Head: Normocephalic.  Right Ear: Tympanic membrane and external ear normal.  Left Ear: Tympanic membrane and external ear normal.  Mild nasal congestion.  Eyes: EOM  and lids are normal. Pupils are equal, round, and reactive to light.  Neck: Normal range of motion. Neck supple. Carotid bruit is not present.  Cardiovascular: Normal rate, regular rhythm, normal heart sounds, intact distal pulses and normal pulses.   Pulmonary/Chest: Breath sounds normal. No respiratory distress.  Abdominal: Soft. Bowel sounds are normal. There is no tenderness. There is no guarding.  Musculoskeletal: Normal range of motion.  There is pain of the paraspinal areas on the right and the left of the lumbar region. There is no palpable step offof the thoracic or the lumbar region.   Lymphadenopathy:       Head (right side): No submandibular adenopathy present.       Head (left side): No submandibular adenopathy present.    She has no cervical adenopathy.  Neurological: She is alert and oriented to person, place, and time. She has normal strength. No cranial nerve deficit or sensory deficit. She exhibits normal muscle tone. Coordination normal.  Skin: Skin is warm and dry.  Psychiatric: She has a normal mood and affect. Her speech is normal.    ED Course  Procedures (including critical care time) Labs Review Labs Reviewed  CBC WITH DIFFERENTIAL - Abnormal; Notable for the  following:    Hemoglobin 15.2 (*)    All other components within normal limits  COMPREHENSIVE METABOLIC PANEL - Abnormal; Notable for the following:    Total Bilirubin <0.2 (*)    GFR calc non Af Amer 85 (*)    All other components within normal limits  URINALYSIS, ROUTINE W REFLEX MICROSCOPIC    Imaging Review No results found.   EKG Interpretation None      MDM Differential diagnosis includes urinary tract infection, renal colic, musculoskeletal back pain, viral illness.   Blood pressure is 127/92, pulse rate 101, otherwise vital signs are within normal limits.  Urine analysis is normal. Complete blood count shows no acute changes, comprehensive metabolic panel shows no acute changes. The patient is awake alert oriented ambulatory with minimal problem. She has pain with certain movements involving her lower back. And pain to some palpation of the lower back area.  Suspect the patient has problems with her back. She may also be fighting off a viral illness given the nausea the feverish feeling and fatigue she is complaining of. The plan at this time is for the patient to be treated with Robaxin and Tylenol codeine for her back related pain. Patient has an appointment with GI. Patient also encouraged to see her GYN physician who has been taking care of her primary care needs for additional evaluation and management. The patient is invited to return to the emergency department if any unusual changes, problems, or concerns.    Final diagnoses:  None    **I have reviewed nursing notes, vital signs, and all appropriate lab and imaging results for this patient.Kathie Dike, PA-C 06/23/14 2134

## 2014-06-22 NOTE — ED Notes (Signed)
Pt c/o pain in LT flank. Pt states that 3 weeks ago she had a kidney stone in her RT kidney. Pt states the past few days she has "feels like she has a fever", "bladder aches," has not had a BM for 4 days, and feels nauseous. Pt states it "feels like there is a ball in my kidney." Denies painful urination or blood in urine.

## 2014-06-22 NOTE — Discharge Instructions (Signed)
Your urine tests, complete blood count, and chemistries are all within normal limits. Please increase water and juices. Please use 800 mg of ibuprofen 3 times daily with food. Please use Robaxin 3 times daily for spasm pain, use Tylenol codeine for pain if needed. Tylenol codeine and Robaxin may cause drowsiness, please use with caution. Please see your specialist as scheduled. Please return to the emergency department if any high fevers, blood in urine or bowels, or emergent changes in your general condition. Back Pain, Adult Low back pain is very common. About 1 in 5 people have back pain.The cause of low back pain is rarely dangerous. The pain often gets better over time.About half of people with a sudden onset of back pain feel better in just 2 weeks. About 8 in 10 people feel better by 6 weeks.  CAUSES Some common causes of back pain include:  Strain of the muscles or ligaments supporting the spine.  Wear and tear (degeneration) of the spinal discs.  Arthritis.  Direct injury to the back. DIAGNOSIS Most of the time, the direct cause of low back pain is not known.However, back pain can be treated effectively even when the exact cause of the pain is unknown.Answering your caregiver's questions about your overall health and symptoms is one of the most accurate ways to make sure the cause of your pain is not dangerous. If your caregiver needs more information, he or she may order lab work or imaging tests (X-rays or MRIs).However, even if imaging tests show changes in your back, this usually does not require surgery. HOME CARE INSTRUCTIONS For many people, back pain returns.Since low back pain is rarely dangerous, it is often a condition that people can learn to [facility] their own.   Remain active. It is stressful on the back to sit or stand in one place. Do not sit, drive, or stand in one place for more than 30 minutes at a time. Take short walks on level surfaces as soon as pain  allows.Try to increase the length of time you walk each day.  Do not stay in bed.Resting more than 1 or 2 days can delay your recovery.  Do not avoid exercise or work.Your body is made to move.It is not dangerous to be active, even though your back may hurt.Your back will likely heal faster if you return to being active before your pain is gone.  Pay attention to your body when you bend and lift. Many people have less discomfortwhen lifting if they bend their knees, keep the load close to their bodies,and avoid twisting. Often, the most comfortable positions are those that put less stress on your recovering back.  Find a comfortable position to sleep. Use a firm mattress and lie on your side with your knees slightly bent. If you lie on your back, put a pillow under your knees.  Only take over-the-counter or prescription medicines as directed by your caregiver. Over-the-counter medicines to reduce pain and inflammation are often the most helpful.Your caregiver may prescribe muscle relaxant drugs.These medicines help dull your pain so you can more quickly return to your normal activities and healthy exercise.  Put ice on the injured area.  Put ice in a plastic bag.  Place a towel between your skin and the bag.  Leave the ice on for 15-20 minutes, 03-04 times a day for the first 2 to 3 days. After that, ice and heat may be alternated to reduce pain and spasms.  Ask your caregiver about trying back  exercises and gentle massage. This may be of some benefit.  Avoid feeling anxious or stressed.Stress increases muscle tension and can worsen back pain.It is important to recognize when you are anxious or stressed and learn ways to manage it.Exercise is a great option. SEEK MEDICAL CARE IF:  You have pain that is not relieved with rest or medicine.  You have pain that does not improve in 1 week.  You have new symptoms.  You are generally not feeling well. SEEK IMMEDIATE MEDICAL CARE  IF:   You have pain that radiates from your back into your legs.  You develop new bowel or bladder control problems.  You have unusual weakness or numbness in your arms or legs.  You develop nausea or vomiting.  You develop abdominal pain.  You feel faint. Document Released: 10/10/2005 Document Revised: 04/10/2012 Document Reviewed: 02/11/2014 [facility] Patient Information 2015 [place], [place]. This information is not intended to replace advice given to you by your health care provider. Make sure you discuss any questions you have with your health care provider.

## 2014-06-24 ENCOUNTER — Ambulatory Visit: Payer: Medicaid Other | Admitting: Adult Health

## 2014-06-24 NOTE — ED Provider Notes (Signed)
Medical screening examination/treatment/procedure(s) were performed by non-physician practitioner and as supervising physician I was immediately available for consultation/collaboration.   EKG Interpretation None        Kedrick Mcnamee W Tiasia Weberg, MD 06/24/14 1222 

## 2014-06-25 NOTE — Progress Notes (Signed)
CC TO PCP °

## 2014-06-26 ENCOUNTER — Encounter (HOSPITAL_COMMUNITY)
Admission: RE | Disposition: A | Discharge status: Home / Self Care | Payer: Self-pay | Source: Ambulatory Visit | Attending: Internal Medicine

## 2014-06-26 ENCOUNTER — Ambulatory Visit (HOSPITAL_COMMUNITY)
Admission: RE | Admit: 2014-06-26 | Discharge: 2014-06-26 | Disposition: A | Discharge status: Home / Self Care | Payer: Medicaid Other | Source: Ambulatory Visit | Attending: Internal Medicine | Admitting: Internal Medicine

## 2014-06-26 ENCOUNTER — Encounter (HOSPITAL_COMMUNITY): Payer: Self-pay

## 2014-06-26 DIAGNOSIS — F988 Other specified behavioral and emotional disorders with onset usually occurring in childhood and adolescence: Secondary | ICD-10-CM | POA: Insufficient documentation

## 2014-06-26 DIAGNOSIS — K3189 Other diseases of stomach and duodenum: Secondary | ICD-10-CM | POA: Insufficient documentation

## 2014-06-26 DIAGNOSIS — Z880 Allergy status to penicillin: Secondary | ICD-10-CM | POA: Diagnosis not present

## 2014-06-26 DIAGNOSIS — R197 Diarrhea, unspecified: Secondary | ICD-10-CM

## 2014-06-26 DIAGNOSIS — K2960 Other gastritis without bleeding: Secondary | ICD-10-CM

## 2014-06-26 DIAGNOSIS — K921 Melena: Secondary | ICD-10-CM | POA: Insufficient documentation

## 2014-06-26 DIAGNOSIS — F329 Major depressive disorder, single episode, unspecified: Secondary | ICD-10-CM | POA: Insufficient documentation

## 2014-06-26 DIAGNOSIS — F4310 Post-traumatic stress disorder, unspecified: Secondary | ICD-10-CM | POA: Diagnosis not present

## 2014-06-26 DIAGNOSIS — K2940 Chronic atrophic gastritis without bleeding: Secondary | ICD-10-CM | POA: Diagnosis not present

## 2014-06-26 DIAGNOSIS — F411 Generalized anxiety disorder: Secondary | ICD-10-CM | POA: Insufficient documentation

## 2014-06-26 DIAGNOSIS — F3289 Other specified depressive episodes: Secondary | ICD-10-CM | POA: Insufficient documentation

## 2014-06-26 DIAGNOSIS — K589 Irritable bowel syndrome without diarrhea: Secondary | ICD-10-CM | POA: Insufficient documentation

## 2014-06-26 DIAGNOSIS — R1013 Epigastric pain: Secondary | ICD-10-CM | POA: Diagnosis not present

## 2014-06-26 DIAGNOSIS — R110 Nausea: Secondary | ICD-10-CM

## 2014-06-26 DIAGNOSIS — K625 Hemorrhage of anus and rectum: Secondary | ICD-10-CM

## 2014-06-26 DIAGNOSIS — K259 Gastric ulcer, unspecified as acute or chronic, without hemorrhage or perforation: Secondary | ICD-10-CM

## 2014-06-26 DIAGNOSIS — I10 Essential (primary) hypertension: Secondary | ICD-10-CM | POA: Insufficient documentation

## 2014-06-26 DIAGNOSIS — R1032 Left lower quadrant pain: Secondary | ICD-10-CM

## 2014-06-26 HISTORY — PX: COLONOSCOPY: SHX5424

## 2014-06-26 HISTORY — PX: ESOPHAGOGASTRODUODENOSCOPY: SHX5428

## 2014-06-26 SURGERY — COLONOSCOPY
Anesthesia: Moderate Sedation

## 2014-06-26 MED ORDER — MIDAZOLAM HCL 5 MG/5ML IJ SOLN
INTRAMUSCULAR | Status: AC
  Filled 2014-06-26: qty 10

## 2014-06-26 MED ORDER — ONDANSETRON HCL 4 MG/2ML IJ SOLN
INTRAMUSCULAR | Status: AC
  Filled 2014-06-26: qty 2

## 2014-06-26 MED ORDER — SODIUM CHLORIDE 0.9 % IJ SOLN
INTRAMUSCULAR | Status: AC
  Filled 2014-06-26: qty 10

## 2014-06-26 MED ORDER — PROMETHAZINE HCL 25 MG/ML IJ SOLN
INTRAMUSCULAR | Status: AC
  Filled 2014-06-26: qty 1

## 2014-06-26 MED ORDER — PROMETHAZINE HCL 25 MG/ML IJ SOLN
25.0000 mg | Freq: Once | INTRAMUSCULAR | Status: AC
  Administered 2014-06-26: 25 mg via INTRAVENOUS

## 2014-06-26 MED ORDER — MEPERIDINE HCL 100 MG/ML IJ SOLN
INTRAMUSCULAR | Status: DC | PRN
  Administered 2014-06-26: 50 mg via INTRAVENOUS
  Administered 2014-06-26 (×4): 25 mg via INTRAVENOUS
  Administered 2014-06-26: 50 mg via INTRAVENOUS

## 2014-06-26 MED ORDER — STERILE WATER FOR IRRIGATION IR SOLN
Status: DC | PRN
  Administered 2014-06-26: 13:00:00

## 2014-06-26 MED ORDER — MIDAZOLAM HCL 5 MG/5ML IJ SOLN
INTRAMUSCULAR | Status: DC | PRN
  Administered 2014-06-26 (×3): 2 mg via INTRAVENOUS
  Administered 2014-06-26 (×4): 1 mg via INTRAVENOUS

## 2014-06-26 MED ORDER — MEPERIDINE HCL 100 MG/ML IJ SOLN
INTRAMUSCULAR | Status: AC
  Filled 2014-06-26: qty 2

## 2014-06-26 MED ORDER — LIDOCAINE VISCOUS 2 % MT SOLN
OROMUCOSAL | Status: AC
  Filled 2014-06-26: qty 15

## 2014-06-26 MED ORDER — SODIUM CHLORIDE 0.9 % IV SOLN
INTRAVENOUS | Status: DC
  Administered 2014-06-26: 12:00:00 via INTRAVENOUS

## 2014-06-26 MED ORDER — LIDOCAINE VISCOUS 2 % MT SOLN
OROMUCOSAL | Status: DC | PRN
  Administered 2014-06-26: 4 mL via OROMUCOSAL

## 2014-06-26 MED ORDER — ONDANSETRON HCL 4 MG/2ML IJ SOLN
INTRAMUSCULAR | Status: DC | PRN
  Administered 2014-06-26: 4 mg via INTRAVENOUS

## 2014-06-26 NOTE — Discharge Instructions (Signed)
Colonoscopy Discharge Instructions  Read the instructions outlined below and refer to this sheet in the next few weeks. These discharge instructions provide you with general information on caring for yourself after you leave the hospital. Your doctor may also give you specific instructions. While your treatment has been planned according to the most current medical practices available, unavoidable complications occasionally occur. If you have any problems or questions after discharge, call Dr. Gala Romney at [phone]. ACTIVITY  You may resume your regular activity, but move at a slower pace for the next 24 hours.   Take frequent rest periods for the next 24 hours.   Walking will help get rid of the air and reduce the bloated feeling in your belly (abdomen).   No driving for 24 hours (because of the medicine (anesthesia) used during the test).    Do not sign any important legal documents or operate any machinery for 24 hours (because of the anesthesia used during the test).  NUTRITION  Drink plenty of fluids.   You may resume your normal diet as instructed by your doctor.   Begin with a light meal and progress to your normal diet. Heavy or fried foods are harder to digest and may make you feel sick to your stomach (nauseated).   Avoid alcoholic beverages for 24 hours or as instructed.  MEDICATIONS  You may resume your normal medications unless your doctor tells you otherwise.  WHAT YOU CAN EXPECT TODAY  Some feelings of bloating in the abdomen.   Passage of more gas than usual.   Spotting of blood in your stool or on the toilet paper.  IF YOU HAD POLYPS REMOVED DURING THE COLONOSCOPY:  No aspirin products for 7 days or as instructed.   No alcohol for 7 days or as instructed.   Eat a soft diet for the next 24 hours.  FINDING OUT THE RESULTS OF YOUR TEST Not all test results are available during your visit. If your test results are not back during the visit, make an appointment  with your caregiver to find out the results. Do not assume everything is normal if you have not heard from your caregiver or the medical facility. It is important for you to follow up on all of your test results.  SEEK IMMEDIATE MEDICAL ATTENTION IF:  You have more than a spotting of blood in your stool.   Your belly is swollen (abdominal distention).   You are nauseated or vomiting.   You have a temperature over 101.  You have abdominal pain or discomfort that is severe or gets worse throughout the day. EGD Discharge instructions Please read the instructions outlined below and refer to this sheet in the next few weeks. These discharge instructions provide you with general information on caring for yourself after you leave the hospital. Your doctor may also give you specific instructions. While your treatment has been planned according to the most current medical practices available, unavoidable complications occasionally occur. If you have any problems or questions after discharge, please call your doctor. ACTIVITY You may resume your regular activity but move at a slower pace for the next 24 hours.  Take frequent rest periods for the next 24 hours.  Walking will help expel (get rid of) the air and reduce the bloated feeling in your abdomen.  No driving for 24 hours (because of the anesthesia (medicine) used during the test).  You may shower.  Do not sign any important legal documents or operate any machinery for 24  hours (because of the anesthesia used during the test).  NUTRITION Drink plenty of fluids.  You may resume your normal diet.  Begin with a light meal and progress to your normal diet.  Avoid alcoholic beverages for 24 hours or as instructed by your caregiver.  MEDICATIONS You may resume your normal medications unless your caregiver tells you otherwise.  WHAT YOU CAN EXPECT TODAY You may experience abdominal discomfort such as a feeling of fullness or gas pains.   FOLLOW-UP Your doctor will discuss the results of your test with you.  SEEK IMMEDIATE MEDICAL ATTENTION IF ANY OF THE FOLLOWING OCCUR: Excessive nausea (feeling sick to your stomach) and/or vomiting.  Severe abdominal pain and distention (swelling).  Trouble swallowing.  Temperature over 101 F (37.8 C).  Rectal bleeding or vomiting of blood.   Begin Protonix 40 mg daily  Begin Librax one capsule before meals and at bedtime as needed for abdominal cramps and diarrhea  Further recommendations to follow pending review of lab studies

## 2014-06-26 NOTE — Op Note (Signed)
[facility] [address] [place], [address]   COLONOSCOPY PROCEDURE REPORT  PATIENT: Stacey, Booth  MR#:         [id number] BIRTHDATE: 09-Sep-1976 , 37  yrs. old GENDER: Female ENDOSCOPIST: Jonathon Bellows, MD FACP [facility] REFERRED BY: PROCEDURE DATE:  06/26/2014 PROCEDURE:     Ileocolonoscopy with segmental biopsy and stool sampling  INDICATIONS: Chronic diarrhea; hematochezia; recent negative serological screen for celiac disease. Patient did not obtain Librax as previously recommended  INFORMED CONSENT:  The risks, benefits, alternatives and imponderables including but not limited to bleeding, perforation as well as the possibility of a missed lesion have been reviewed.  The potential for biopsy, lesion removal, etc. have also been discussed.  Questions have been answered.  All parties agreeable. Please see the history and physical in the medical record for more information.  MEDICATIONS: Versed 10 mg IV and Demerol 200 mg IV in divided doses. Phenergan 25 mg IV. Zofran 4 mg IV. Xylocaine gel orally  DESCRIPTION OF PROCEDURE:  After a digital rectal exam was performed, the EG-[id number] ([id number]) and EC-[id number] ([id number]) colonoscope was advanced from the anus through the rectum and colon to the area of the cecum, ileocecal valve and appendiceal orifice. The cecum was deeply intubated.  These structures were well-seen and photographed for the record.  From the level of the cecum and ileocecal valve, the scope was slowly and cautiously withdrawn. The mucosal surfaces were carefully surveyed utilizing scope tip deflection to facilitate fold flattening as needed.  The scope was pulled down into the rectum where a thorough examination including retroflexion was performed.    FINDINGS:  Adequate preparation. Normal rectum. Normal. Colonic mucosa. Normal appearing distal 10 cm of terminal ileal mucosa.  THERAPEUTIC / DIAGNOSTIC MANEUVERS PERFORMED:  segmental  biopsies the ascending and descending/sigmoid segments taken.  Subsequently, stool sample was obtained for GI pathogen panel.  COMPLICATIONS: none  CECAL WITHDRAWAL TIME:  7 minutes  IMPRESSION:   normal ileocolonoscopy -- status post segmental biopsy and stool sampling  RECOMMENDATIONS:  Prescription for Librax again provided. Also, begin Protonix 40 mg daily. See EGD report.   _______________________________ eSigned:  R. Roetta Sessions, MD FACP [facility] 06/26/2014 2:26 PM   CC:

## 2014-06-26 NOTE — Op Note (Signed)
[facility] [address] [place], [address]   ENDOSCOPY PROCEDURE REPORT  PATIENT: Stacey Booth, Stacey Booth  MR#: [id number] BIRTHDATE: [birth year] , 37  yrs. old GENDER: Female ENDOSCOPIST: Jonathon Bellows, MD FACP [facility] REFERRED BY: PROCEDURE DATE:  06/26/2014 PROCEDURE:     EGD with gastric biopsy  INDICATIONS:     dyspepsia; epigastric pain; nausea and vomiting  INFORMED CONSENT:   The risks, benefits, limitations, alternatives and imponderables have been discussed.  The potential for biopsy, esophogeal dilation, etc. have also been reviewed.  Questions have been answered.  All parties agreeable.  Please see the history and physical in the medical record for more information.  MEDICATIONS:   Versed 8 mg IV and Demerol 175 mg IV in divided doses. Phenergan 25 mg IV Zofran 4 mg IV. Xylocaine gel orally  DESCRIPTION OF PROCEDURE:   The EG-[id number] ([id number])  endoscope was introduced through the mouth and advanced to the second portion of the duodenum without difficulty or limitations.  The mucosal surfaces were surveyed very carefully during advancement of the scope and upon withdrawal.  Retroflexion view of the proximal stomach and esophagogastric junction was performed.      FINDINGS: Normal esophagus. Stomach empty. Mild mottling of the gastric mucosa with scattered erosions. No ulcer or infiltrating process. No hiatal hernia. Patent pylorus. Normal first and second portion of the duodenum  THERAPEUTIC / DIAGNOSTIC MANEUVERS PERFORMED:  Biopsies the abnormal gastric mucosa taken for histologic study   COMPLICATIONS:  None  IMPRESSION:  Abnormal gastric mucosa-status post gastric biopsy  RECOMMENDATIONS:   Followup on pathology. Further recommendations to follow. See colonoscopy report.    _______________________________ R. Roetta Sessions, MD FACP [facility] eSigned:  R. Roetta Sessions, MD FACP [facility] 06/26/2014 1:53 PM     CC:

## 2014-06-26 NOTE — H&P (View-Only) (Signed)
Primary Care Physician:  PROVIDER NOT IN SYSTEM  Primary Gastroenterologist:  Roetta Sessions, MD   Chief Complaint  Patient presents with  . Irritable Bowel Syndrome  . Nausea    very sick when having BM    HPI:  Stacey Booth is a 38 y.o. female here for further evaluation of abdominal pain, nausea, diarrhea at the request of Peter Garter, NP.   Always had stomach problems. "gastritis attacks". GB out 7 years ago, if eat greasy foods then diarrhea. Over the last one year everything worse. Postprandial nausea, loose stools. Drinks mostly fluids and may eat more breads cause tolerates it. Now with elevated trigyclerides, has to stay away from the "white" stuff. Stays thirsty. Avoids going out to eat. Constantly stays nauseated. Waits until night time to eat solid food due to unpredictable GI symptoms. Wakes up during the night with terrible nausea, cramps. When goes to bathroom, stool looks normal at first with mucous. Used to be mostly left side pain but now whole lower abdomen. Will have to go back to bathroom several times and eventually watery stools. Then may skip couple of days. Drinking Boost. Feels tired. Blood on tissue when rectal irritation couple of times. No melena. She no longer eats during the day or she will have abd pain and diarrhea all day long. Denies relief of abdominal pain with BMs.    For years been told IBS. Bentyl helped some of the abdominal pain after meals and helped LLQ. No heartburn. Lactose really bothers stomach. Takes advil every day. Nausea every day regardless of BMs. Developed epig pain few three weeks. Takes Klonopin only on occasion.   Current Outpatient Prescriptions  Medication Sig Dispense Refill  . acetaminophen (TYLENOL) 500 MG tablet Take 1,000 mg by mouth as needed.      Marland Kitchen amphetamine-dextroamphetamine (ADDERALL XR) 10 MG 24 hr capsule Take 10 mg by mouth daily.      . clonazePAM (KLONOPIN) 1 MG tablet Take 1 mg by mouth 2 (two) times daily as  needed for anxiety.      Marland Kitchen estradiol (ESTRACE) 1 MG tablet TAKE 1 TABLET BY MOUTH TWICE DAILY  60 tablet  3  . hydrochlorothiazide (HYDRODIURIL) 25 MG tablet Take 1 tablet (25 mg total) by mouth daily.  30 tablet  11  . ibuprofen (ADVIL,MOTRIN) 200 MG tablet Take 600-800 mg by mouth every 6 (six) hours as needed. For pain      . promethazine (PHENERGAN) 25 MG tablet Take 1 tablet (25 mg total) by mouth every 6 (six) hours as needed for nausea or vomiting.  30 tablet  1  . propranolol (INDERAL) 20 MG tablet Take 1 daily in am  30 tablet  6  . zolpidem (AMBIEN) 10 MG tablet TAKE 1 TABLET BY MOUTH EVERY NIGHT AT BEDTIME AS NEEDED FOR SLEEP  30 tablet  0  .       . promethazine (PHENERGAN) 25 MG tablet Take 1 tablet (25 mg total) by mouth every 6 (six) hours as needed for nausea.  8 tablet  0   No current facility-administered medications for this visit.    Allergies as of 06/19/2014 - Review Complete 06/19/2014  Allergen Reaction Noted  . Penicillins Hives 03/23/2011    Past Medical History  Diagnosis Date  . PONV (postoperative nausea and vomiting)   . Renal disorder     kidney stones  . Arthritis   . ADD (attention deficit disorder)   . PTSD (post-traumatic stress disorder)   .  Anxiety   . Depression   . Hypertension   . Vaginal dryness 02/18/2014  . IBS (irritable bowel syndrome) 03/19/2014  . Hematuria 06/02/2014  . History of kidney stones 06/02/2014    Past Surgical History  Procedure Laterality Date  . Cystoscopy  5-6 months ago    [place]  . Cholecystectomy    . Tubal ligation    . Tonsillectomy    . Back surgery      ruptured disc-laparoscopic repair  . Vaginal hysterectomy  11/30/2011    Procedure: HYSTERECTOMY VAGINAL;  Surgeon: Lazaro Arms, MD;  Location: [facility];  Service: Gynecology;  Laterality: N/A;  transvaginal hysterectomy with right salpingo-oophorectomy  . Salpingoophorectomy  11/30/2011    Procedure: SALPINGO OOPHERECTOMY;  Surgeon: Lazaro Arms, MD;   Location: [facility];  Service: Gynecology;  Laterality: Right;    Family History  Problem Relation Age of Onset  . Anesthesia problems Neg Hx   . Hypotension Neg Hx   . Malignant hyperthermia Neg Hx   . Pseudochol deficiency Neg Hx   . Cancer Father   . Diabetes Father   . Heart attack Father   . Stroke Father   . Diabetes Maternal Grandmother   . Thyroid disease Mother   . Goiter Mother   . Schizophrenia Sister   . Bipolar disorder Sister   . ADD / ADHD Son   . Asperger's syndrome Son   . Anxiety disorder Son   . Autism spectrum disorder Son     History   Social History  . Marital Status: Married    Spouse Name: N/A    Number of Children: N/A  . Years of Education: N/A   Occupational History  . Not on file.   Social History Main Topics  . Smoking status: Current Every Day Smoker -- 0.50 packs/day for 15 years    Types: Cigarettes  . Smokeless tobacco: Never Used  . Alcohol Use: No  . Drug Use: No  . Sexual Activity: Yes    Birth Control/ Protection: Surgical   Other Topics Concern  . Not on file   Social History Narrative  . No narrative on file      ROS:  General: Negative for anorexia, weight loss, fever, chills. Eyes: Negative for vision changes.  ENT: Negative for hoarseness, difficulty swallowing , nasal congestion. CV: Negative for chest pain, angina, palpitations, dyspnea on exertion, peripheral edema.  Respiratory: Negative for dyspnea at rest, dyspnea on exertion, cough, sputum, wheezing.  GI: See history of present illness. GU:  Negative for dysuria, hematuria, urinary incontinence, urinary frequency, nocturnal urination. Has h/o kidney stones and microscopic hematuria. Currently seeing Dr. Annabell Howells. MS: Negative for joint pain. Intermittent neck and low back pain.  Derm: Negative for rash or itching.  Neuro: Negative for weakness, abnormal sensation, seizure, frequent headaches, memory loss, confusion.  Psych: Negative for anxiety, depression,  suicidal ideation, hallucinations.  Endo: Negative for unusual weight change.  Heme: Negative for bruising or bleeding. Allergy: Negative for rash or hives.    Physical Examination:  BP 123/83  Pulse 88  Temp(Src) 97.6 F (36.4 C) (Oral)  Ht  (1.702 m)  Wt 159 lb 6.4 oz (72.303 kg)  BMI 24.96 kg/m2  LMP 11/02/2011   General: Well-nourished, well-developed in no acute distress. Tearful. Head: Normocephalic, atraumatic.   Eyes: Conjunctiva pink, no icterus. Mouth: Oropharyngeal mucosa moist and pink , no lesions erythema or exudate. Neck: Supple without thyromegaly, masses, or lymphadenopathy.  Lungs: Clear to  auscultation bilaterally.  Heart: Regular rate and rhythm, no murmurs rubs or gallops.  Abdomen: Bowel sounds are normal, LLQ tenderness, nondistended, no hepatosplenomegaly or masses, no abdominal bruits or    hernia , no rebound or guarding.   Rectal: deferred Extremities: No lower extremity edema. No clubbing or deformities.  Neuro: Alert and oriented x 4 , grossly normal neurologically.  Skin: Warm and dry, no rash or jaundice.   Psych: Alert and cooperative, normal mood and affect.  Labs: Lab Results  Component Value Date   WBC 7.9 03/19/2014   HGB 12.8 03/19/2014   HCT 38.6 03/19/2014   MCV 92.3 03/19/2014   PLT 283 03/19/2014   Lab Results  Component Value Date   ALT 16 03/19/2014   AST 17 03/19/2014   ALKPHOS 65 03/19/2014   BILITOT 0.2 03/19/2014   Lab Results  Component Value Date   CREATININE 0.68 03/19/2014   BUN 7 03/19/2014   NA 137 03/19/2014   K 4.7 03/19/2014   CL 104 03/19/2014   CO2 25 03/19/2014     Imaging Studies: No results found.

## 2014-06-26 NOTE — Interval H&P Note (Signed)
History and Physical Interval Note:  06/26/2014 1:22 PM  Stacey Booth  has presented today for surgery, with the diagnosis of EPIGASTRIC PAIN, LLQ PAIN, DIARRHEA, RECTAL BLEED, NAUSEA  The various methods of treatment have been discussed with the patient and family. After consideration of risks, benefits and other options for treatment, the patient has consented to  Procedure(s) with comments: COLONOSCOPY (N/A) - 2:00-moved to 1245 [address] Ann notified pt ESOPHAGOGASTRODUODENOSCOPY (EGD) (N/A) as a surgical intervention .  The patient's history has been reviewed, patient examined, no change in status, stable for surgery.  I have reviewed the patient's chart and labs.  Questions were answered to the patient's satisfaction.     Stacey Booth  Celiac screen negative. EGD and colonoscopy per plan.The risks, benefits, limitations, imponderables and alternatives regarding both EGD and colonoscopy have been reviewed with the patient. Questions have been answered. All parties agreeable.

## 2014-06-27 LAB — GI PATHOGEN PANEL BY PCR, STOOL
C difficile toxin A/B: NEGATIVE
Campylobacter by PCR: NEGATIVE
Cryptosporidium by PCR: NEGATIVE
E COLI 0157 BY PCR: NEGATIVE
E coli (ETEC) LT/ST: NEGATIVE
E coli (STEC): NEGATIVE
G LAMBLIA BY PCR: NEGATIVE
Norovirus GI/GII: NEGATIVE
ROTAVIRUS A BY PCR: NEGATIVE
SALMONELLA BY PCR: NEGATIVE
Shigella by PCR: NEGATIVE

## 2014-06-28 ENCOUNTER — Telehealth: Payer: Self-pay | Admitting: Internal Medicine

## 2014-06-28 NOTE — Telephone Encounter (Signed)
Pt called ; burning and nausea when she lays down today. Vomited. Some trouble with keeping phenergan down.  No PPi. No abd pain. Results of  egd and tcs reviewed ; bxpending.prior hysterectomy. No fever  I advised clear liquids the weekend. Try phenergan again.   rx for protonix 40 mg daily disp 14 no refills. Called into [place] [facility] [phone]. To the er if no better wn 24 hours.   Call office next week w a progress report

## 2014-07-01 ENCOUNTER — Encounter: Payer: Self-pay | Admitting: Internal Medicine

## 2014-07-01 ENCOUNTER — Encounter: Payer: Self-pay | Admitting: Adult Health

## 2014-07-01 ENCOUNTER — Encounter (HOSPITAL_COMMUNITY): Payer: Self-pay | Admitting: Internal Medicine

## 2014-07-01 NOTE — Telephone Encounter (Signed)
SCHEDULED AND LETTER SENT  °

## 2014-07-01 NOTE — Telephone Encounter (Signed)
Please schedule ov asap 

## 2014-07-01 NOTE — Telephone Encounter (Signed)
She is not vomited now but her stomach is still hurting. She is eat a liquid diet but it is still bothering her. She is taking the Protonix. She is wanting to know what she should do about her stomach now? Should she go back to her PCP. Please advise

## 2014-07-01 NOTE — Telephone Encounter (Signed)
Pathology pending. GI pathogen panel negative. Symptoms out of proportion to objective findings thus far. Would offer next available recheck with one of the extenders

## 2014-07-04 ENCOUNTER — Encounter: Payer: Self-pay | Admitting: Internal Medicine

## 2014-07-14 ENCOUNTER — Other Ambulatory Visit: Payer: Self-pay | Admitting: Obstetrics & Gynecology

## 2014-07-30 ENCOUNTER — Encounter: Payer: Self-pay | Admitting: Gastroenterology

## 2014-07-30 ENCOUNTER — Ambulatory Visit (INDEPENDENT_AMBULATORY_CARE_PROVIDER_SITE_OTHER): Payer: Medicaid Other | Admitting: Gastroenterology

## 2014-07-30 VITALS — BP 140/108 | HR 100 | Temp 99.5°F | Ht 67.0 in | Wt 155.0 lb

## 2014-07-30 DIAGNOSIS — K219 Gastro-esophageal reflux disease without esophagitis: Secondary | ICD-10-CM | POA: Insufficient documentation

## 2014-07-30 DIAGNOSIS — I10 Essential (primary) hypertension: Secondary | ICD-10-CM

## 2014-07-30 DIAGNOSIS — K589 Irritable bowel syndrome without diarrhea: Secondary | ICD-10-CM

## 2014-07-30 MED ORDER — HYOSCYAMINE SULFATE 0.125 MG SL SUBL: 0.1250 mg | SUBLINGUAL_TABLET | SUBLINGUAL | Status: DC | PRN

## 2014-07-30 NOTE — Assessment & Plan Note (Signed)
Cyclical diarrhea with abdominal pain, following several days of no BM. Declining Miralax for days without BM. Has noted improvement using Librax for diarrhea but unable to afford this long-term. From her report, getting to know "her body" and what works well for her. Will trial Levsin prn, instructed to continue with fiber intake, add a probiotic daily. Patient to call if issues with Levsin. Return in 3 months. Overall improved from last visit.

## 2014-07-30 NOTE — Progress Notes (Signed)
Referring Provider: Tylene Fantasia., PA-C Primary Care Physician:  PROVIDER NOT IN SYSTEM Primary GI: Dr. Jena Gauss   Chief Complaint  Patient presents with  . Abdominal Pain    HPI:   Stacey Booth presents today in follow-up with history of abdominal pain and diarrhea. EGD and colonoscopy recently performed without significant findings. Neurontin started yesterday. Feels over anxious now, keyed up, difficulty sleeping. BP up. Awake since 4 am.  Librax too expensive, 60$. Worked well but unable to afford. Bentyl worked ok in the past but caused drowsiness. Stopped eating late at night with improvement in GERD symptoms. During stressful and anxious moments will have abdominal pain. A lot of gas, lays on side for it to pass. Will not have a BM for several days then massive amounts of diarrhea. Declining laxatives. Doesn't feel constipated. Eating fiber one. Hasn't tried a probiotic. Overall improved with supportive measures.    Past Medical History  Diagnosis Date  . PONV (postoperative nausea and vomiting)   . Renal disorder     kidney stones  . Arthritis   . ADD (attention deficit disorder)   . PTSD (post-traumatic stress disorder)   . Anxiety   . Depression   . Hypertension   . Vaginal dryness 02/18/2014  . IBS (irritable bowel syndrome) 03/19/2014  . Hematuria 06/02/2014  . History of kidney stones 06/02/2014  . Endometriosis     s/p hysterectomy    Past Surgical History  Procedure Laterality Date  . Cystoscopy  5-6 months ago    [place]  . Cholecystectomy    . Tubal ligation    . Tonsillectomy    . Back surgery      ruptured disc-laparoscopic repair  . Vaginal hysterectomy  11/30/2011    Procedure: HYSTERECTOMY VAGINAL;  Surgeon: Lazaro Arms, MD;  Location: [facility];  Service: Gynecology;  Laterality: N/A;  transvaginal hysterectomy with right salpingo-oophorectomy  . Salpingoophorectomy  11/30/2011    Procedure: SALPINGO OOPHERECTOMY;  Surgeon: Lazaro Arms, MD;   Location: [facility];  Service: Gynecology;  Laterality: Right;  . Endometrial ablation    . Colonoscopy N/A 06/26/2014    Dr. Rourk:normal ileocolonoscopy s/p biopsy. Negative biopsies  . Esophagogastroduodenoscopy N/A 06/26/2014    Dr. Ace Gins gastric mucosa-status post gastric biopsy, slight chronic gastritis without H.pylori    Current Outpatient Prescriptions  Medication Sig Dispense Refill  . acetaminophen (TYLENOL) 500 MG tablet Take 1,000 mg by mouth every 6 (six) hours as needed for mild pain.       Marland Kitchen amphetamine-dextroamphetamine (ADDERALL XR) 10 MG 24 hr capsule Take 15 mg by mouth daily.       . clonazePAM (KLONOPIN) 1 MG tablet Take 0.5 mg by mouth 2 (two) times daily as needed for anxiety.       Marland Kitchen estradiol (ESTRACE) 1 MG tablet Take 1 mg by mouth 2 (two) times daily.      Marland Kitchen estradiol (ESTRACE) 1 MG tablet TAKE 1 TABLET BY MOUTH TWICE DAILY  60 tablet  11  . gabapentin (NEURONTIN) 100 MG capsule Take 100 mg by mouth 2 (two) times daily.      . hydrochlorothiazide (HYDRODIURIL) 25 MG tablet Take 1 tablet (25 mg total) by mouth daily.  30 tablet  11  . ibuprofen (ADVIL,MOTRIN) 200 MG tablet Take 600-800 mg by mouth every 6 (six) hours as needed. For pain      . ibuprofen (ADVIL,MOTRIN) 800 MG tablet Take 1 tablet (800 mg total) by mouth  3 (three) times daily.  21 tablet  0  . pantoprazole (PROTONIX) 40 MG tablet Take 40 mg by mouth daily.      . promethazine (PHENERGAN) 25 MG tablet Take 1 tablet (25 mg total) by mouth every 6 (six) hours as needed for nausea or vomiting.  30 tablet  1  . propranolol (INDERAL) 20 MG tablet Take 20 mg by mouth daily. Take 1 daily in am      . zolpidem (AMBIEN) 10 MG tablet TAKE 1 TABLET BY MOUTH EVERY DAY AT BEDTIME AS NEEDED  30 tablet  3  . acetaminophen-codeine (TYLENOL #3) 300-30 MG per tablet Take 1-2 tablets by mouth every 6 (six) hours as needed.  20 tablet  0  . DiphenhydrAMINE HCl, Sleep, 50 MG CAPS Take 1 capsule by mouth at bedtime as  needed (Sleep).      . methocarbamol (ROBAXIN) 500 MG tablet Take 1 tablet (500 mg total) by mouth 3 (three) times daily.  21 tablet  0  . polyethylene glycol-electrolytes (TRILYTE) 420 G solution Take 4,000 mLs by mouth as directed.  4000 mL  0  . zolpidem (AMBIEN) 10 MG tablet Take 10 mg by mouth at bedtime as needed for sleep.       No current facility-administered medications for this visit.    Allergies as of 07/30/2014 - Review Complete 07/30/2014  Allergen Reaction Noted  . Naproxen Other (See Comments) 06/22/2014  . Penicillins Hives 03/23/2011    Family History  Problem Relation Age of Onset  . Anesthesia problems Neg Hx   . Hypotension Neg Hx   . Malignant hyperthermia Neg Hx   . Pseudochol deficiency Neg Hx   . Cancer Father   . Diabetes Father   . Heart attack Father   . Stroke Father   . Diabetes Maternal Grandmother   . Thyroid disease Mother   . Goiter Mother   . Schizophrenia Sister   . Bipolar disorder Sister   . ADD / ADHD Son   . Asperger's syndrome Son   . Anxiety disorder Son   . Autism spectrum disorder Son     History   Social History  . Marital Status: Married    Spouse Name: N/A    Number of Children: 2  . Years of Education: N/A   Occupational History  . unemployed    Social History Main Topics  . Smoking status: Current Every Day Smoker -- 0.50 packs/day for 15 years    Types: Cigarettes  . Smokeless tobacco: Never Used     Comment: 1/2 pack daily  . Alcohol Use: No  . Drug Use: No  . Sexual Activity: Yes    Birth Control/ Protection: Surgical   Other Topics Concern  . None   Social History Narrative  . None    Review of Systems: As mentioned in HPI.   Physical Exam: BP 140/102  Pulse 100  Temp(Src) 99.5 F (37.5 C) (Oral)  Ht 5\' 7"  (1.702 m)  Wt 155 lb (70.308 kg)  BMI 24.27 kg/m2  LMP 11/02/2011 General:   Alert and oriented. No distress noted. Pleasant and cooperative.  Head:  Normocephalic and  atraumatic. Eyes:  Conjuctiva clear without scleral icterus. Mouth:  Oral mucosa pink and moist. Good dentition. No lesions. Abdomen:  +BS, soft, mild TTP RUQ and non-distended. No rebound or guarding. No HSM or masses noted. Msk:  Symmetrical without gross deformities. Normal posture. Extremities:  Without edema. Neurologic:  Alert and  oriented x4;  grossly normal neurologically. Skin:  Intact without significant lesions or rashes. Psych:  Alert and cooperative. Normal mood and affect.

## 2014-07-30 NOTE — Assessment & Plan Note (Signed)
Elevated BP today more than normal, coinciding with start of Neurontin. started yesterday and now feels "keyed" up. Instructed to present to the ED if blurred vision, chest pain, severe headache. Stop Neurontin and contact prescribing provider. No concern for stopping abruptly as she just started yesterday.

## 2014-07-30 NOTE — Assessment & Plan Note (Signed)
Improved with dietary modification and taking Protonix. Continue current measures.

## 2014-07-30 NOTE — Patient Instructions (Signed)
I have sent a prescription for Levsin to the pharmacy. This is for abdominal cramping and loose stool. You may take every 4 hours as needed.  A probiotic would be good to take daily. Some over the counter options include Digestive Advantage, [employer]'s Colon Health, [employer] brand, Restora, and Align.   We will see you back in 3 months!

## 2014-07-31 NOTE — Progress Notes (Signed)
cc'ed to pcp °

## 2014-08-12 ENCOUNTER — Other Ambulatory Visit: Payer: Self-pay | Admitting: Adult Health

## 2014-08-25 ENCOUNTER — Encounter: Payer: Self-pay | Admitting: Gastroenterology

## 2014-08-28 ENCOUNTER — Ambulatory Visit: Payer: Medicaid Other | Admitting: Adult Health

## 2014-09-02 ENCOUNTER — Ambulatory Visit: Payer: Medicaid Other | Admitting: Adult Health

## 2014-09-05 ENCOUNTER — Ambulatory Visit (INDEPENDENT_AMBULATORY_CARE_PROVIDER_SITE_OTHER): Payer: Medicaid Other | Admitting: Adult Health

## 2014-09-05 ENCOUNTER — Encounter: Payer: Self-pay | Admitting: Adult Health

## 2014-09-05 VITALS — BP 140/100 | Ht 67.0 in | Wt 158.5 lb

## 2014-09-05 DIAGNOSIS — I10 Essential (primary) hypertension: Secondary | ICD-10-CM

## 2014-09-05 DIAGNOSIS — G479 Sleep disorder, unspecified: Secondary | ICD-10-CM

## 2014-09-05 HISTORY — DX: Sleep disorder, unspecified: G47.9

## 2014-09-05 MED ORDER — ZOLPIDEM TARTRATE 10 MG PO TABS: 10.0000 mg | ORAL_TABLET | Freq: Every day | ORAL | Status: DC

## 2014-09-05 NOTE — Patient Instructions (Signed)
Take BP meds  Try ambien 10 mg 1 at hs And follow up in 2 weeks

## 2014-09-05 NOTE — Progress Notes (Signed)
Subjective:     Patient ID: Stacey CoveyRobin S Booth, female   DOB: [birth year], 38 y.o.   MRN: [id number]  HPI Stacey Booth is a 38 year old white female in for BP check.  Review of Systems See HPI Reviewed past medical,surgical, social and family history. Reviewed medications and allergies.     Objective:   Physical Exam BP 140/100 mmHg  Ht 5\' 7"  (1.702 m)  Wt 158 lb 8 oz (71.895 kg)  BMI 24.82 kg/m2  LMP 11/02/2011 Skin warm and dry. Lungs: clear to ausculation bilaterally. Cardiovascular: regular rate and rhythm.She says BP has been good but had oral surgery Monday and not sleeping well. Has seen chiropractor and has lift in shoe so back is better, She [facility] and referred to pain center.   If BP still elevated in 2 weeks will add another med to regimen.  Assessment:     Hypertension Sleep disturbance    Plan:     Rx ambien 10 mg #30 1 at hs with 1 refill Follow up in 2 weeks to check BP and see if sleep better

## 2014-09-19 ENCOUNTER — Encounter: Payer: Self-pay | Admitting: Adult Health

## 2014-09-26 ENCOUNTER — Ambulatory Visit: Payer: Medicaid Other | Admitting: Adult Health

## 2014-09-30 ENCOUNTER — Ambulatory Visit (INDEPENDENT_AMBULATORY_CARE_PROVIDER_SITE_OTHER): Payer: Medicaid Other | Admitting: Adult Health

## 2014-09-30 ENCOUNTER — Encounter: Payer: Self-pay | Admitting: Adult Health

## 2014-09-30 VITALS — BP 134/80 | Ht 67.0 in | Wt 158.0 lb

## 2014-09-30 DIAGNOSIS — I10 Essential (primary) hypertension: Secondary | ICD-10-CM

## 2014-09-30 MED ORDER — ACYCLOVIR 5 % EX OINT: 1.0000 "application " | TOPICAL_OINTMENT | CUTANEOUS | Status: DC

## 2014-09-30 NOTE — Progress Notes (Signed)
Subjective:     Patient ID: Stacey Booth, female   DOB: 09/26/1976, 38 y.o.   MRN: [id number]  HPI Stacey Booth is a 38 year old white female in for BP, says she is doing better, but has sore spot on mouth.She had oral surgery recently and it keeps opening up.  Review of Systems See HPI Reviewed past medical,surgical, social and family history. Reviewed medications and allergies.     Objective:   Physical Exam BP 134/80 mmHg  Ht 5\' 7"  (1.702 m)  Wt 158 lb (71.668 kg)  BMI 24.74 kg/m2  LMP 11/02/2011   Skin warm and dry. Lungs: clear to ausculation bilaterally. Cardiovascular: regular rate and rhythm., has crack left side of mouth, had oral surgery recently  Assessment:    Hypertension     Plan:     Continue meds Follow up prn Rx zovirax ointment for crack on side of mouth, use every  3-4 hours prn

## 2014-09-30 NOTE — Patient Instructions (Signed)
BP much better Continue meds Follow up prn

## 2014-10-07 ENCOUNTER — Other Ambulatory Visit: Payer: Self-pay | Admitting: Internal Medicine

## 2014-10-30 ENCOUNTER — Ambulatory Visit: Payer: Medicaid Other | Admitting: Gastroenterology

## 2014-10-30 ENCOUNTER — Encounter: Payer: Self-pay | Admitting: Gastroenterology

## 2014-10-30 ENCOUNTER — Telehealth: Payer: Self-pay | Admitting: Internal Medicine

## 2014-10-30 NOTE — Telephone Encounter (Signed)
PATIENT WAS A NO SHOW 10/30/14 LETTER SENT °

## 2014-11-03 ENCOUNTER — Other Ambulatory Visit: Payer: Self-pay | Admitting: Adult Health

## 2014-11-03 ENCOUNTER — Encounter: Payer: Self-pay | Admitting: Adult Health

## 2014-11-03 MED ORDER — ZOLPIDEM TARTRATE 10 MG PO TABS: 10.0000 mg | ORAL_TABLET | Freq: Every evening | ORAL | Status: DC | PRN

## 2015-02-18 ENCOUNTER — Other Ambulatory Visit: Payer: Self-pay | Admitting: Adult Health

## 2015-02-18 ENCOUNTER — Encounter: Payer: Self-pay | Admitting: Adult Health

## 2015-02-18 MED ORDER — NYSTATIN 100000 UNIT/GM EX CREA: 1.0000 "application " | TOPICAL_CREAM | Freq: Two times a day (BID) | CUTANEOUS | Status: DC

## 2015-02-18 MED ORDER — PANTOPRAZOLE SODIUM 40 MG PO TBEC: 40.0000 mg | DELAYED_RELEASE_TABLET | Freq: Every day | ORAL | Status: DC

## 2015-02-26 ENCOUNTER — Other Ambulatory Visit: Payer: Self-pay | Admitting: Adult Health

## 2015-03-05 ENCOUNTER — Ambulatory Visit: Payer: Medicaid Other | Admitting: Adult Health

## 2015-03-10 ENCOUNTER — Ambulatory Visit (INDEPENDENT_AMBULATORY_CARE_PROVIDER_SITE_OTHER): Payer: Medicaid Other | Admitting: Adult Health

## 2015-03-10 ENCOUNTER — Encounter: Payer: Self-pay | Admitting: Adult Health

## 2015-03-10 VITALS — BP 112/80 | HR 80 | Ht 67.0 in | Wt 161.0 lb

## 2015-03-10 DIAGNOSIS — L089 Local infection of the skin and subcutaneous tissue, unspecified: Secondary | ICD-10-CM | POA: Diagnosis not present

## 2015-03-10 MED ORDER — DOXYCYCLINE HYCLATE 100 MG PO TABS: 100.0000 mg | ORAL_TABLET | Freq: Two times a day (BID) | ORAL | Status: DC

## 2015-03-10 MED ORDER — PROMETHAZINE HCL 25 MG PO TABS: 25.0000 mg | ORAL_TABLET | Freq: Four times a day (QID) | ORAL | Status: DC | PRN

## 2015-03-10 NOTE — Patient Instructions (Signed)
Referred to  Dr Renaldo ReelHuang 6/21 at 8:30 am [facility] Return in 2 weeks for physical and fasting labs that day Take doxycycline 100 mg 1 bid x 2 weeks

## 2015-03-10 NOTE — Progress Notes (Signed)
Subjective:     Patient ID: Stacey Booth, female   DOB: 07-24-1976, 39 y.o.   MRN: [id number]  HPI Stacey Booth is a 39 year old white female in complaining of rash since December, it started with big toe having pus at nail and has spread over most of body,She says it starts out like pimple then has pus and ?seeds come out then looks like round raw area then heals, is worse if sun hits skin.Has  Had sam thing at finger nails.  Review of Systems + rash  Reviewed past medical,surgical, social and family history. Reviewed medications and allergies.     Objective:   Physical Exam BP 112/80 mmHg  Pulse 80  Ht 5\' 7"  (1.702 m)  Wt 161 lb (73.029 kg)  BMI 25.21 kg/m2  LMP 01/09/2013Skin warn and dry and she has round pustule to vesicle like areas over arms, trunk and legs, even in peri area and says she has pus from finger nails and toe nails.Dr Despina HiddenEure for co exam and he agrees needs dermatology consult and probable biopsy, will give doxycyline to see if helps    Assessment:     Pustules     Plan:    Rx doxycycline 100 mg 1 bid x 14 days #28 with 1 refill Refilled phenergan 25 mg #30 1 every 6 hours prn with 1 refill [facility]21 appt at 8:30 am phone # [phone] fax [phone]    Return in 2 week for physical

## 2015-03-25 ENCOUNTER — Ambulatory Visit (INDEPENDENT_AMBULATORY_CARE_PROVIDER_SITE_OTHER): Payer: Medicaid Other | Admitting: Adult Health

## 2015-03-25 ENCOUNTER — Encounter: Payer: Self-pay | Admitting: Adult Health

## 2015-03-25 VITALS — BP 110/72 | HR 76 | Ht 66.0 in | Wt 159.0 lb

## 2015-03-25 DIAGNOSIS — Z1212 Encounter for screening for malignant neoplasm of rectum: Secondary | ICD-10-CM

## 2015-03-25 DIAGNOSIS — Z79818 Long term (current) use of other agents affecting estrogen receptors and estrogen levels: Secondary | ICD-10-CM

## 2015-03-25 DIAGNOSIS — I10 Essential (primary) hypertension: Secondary | ICD-10-CM

## 2015-03-25 DIAGNOSIS — G479 Sleep disorder, unspecified: Secondary | ICD-10-CM

## 2015-03-25 DIAGNOSIS — L089 Local infection of the skin and subcutaneous tissue, unspecified: Secondary | ICD-10-CM

## 2015-03-25 DIAGNOSIS — Z0000 Encounter for general adult medical examination without abnormal findings: Secondary | ICD-10-CM

## 2015-03-25 DIAGNOSIS — Z01419 Encounter for gynecological examination (general) (routine) without abnormal findings: Secondary | ICD-10-CM | POA: Diagnosis not present

## 2015-03-25 DIAGNOSIS — Z79899 Other long term (current) drug therapy: Secondary | ICD-10-CM

## 2015-03-25 HISTORY — DX: Other long term (current) drug therapy: Z79.899

## 2015-03-25 LAB — HEMOCCULT GUIAC POC 1CARD (OFFICE): FECAL OCCULT BLD: NEGATIVE

## 2015-03-25 MED ORDER — ZOLPIDEM TARTRATE 10 MG PO TABS: 10.0000 mg | ORAL_TABLET | Freq: Every evening | ORAL | Status: DC | PRN

## 2015-03-25 MED ORDER — PROPRANOLOL HCL 20 MG PO TABS: 20.0000 mg | ORAL_TABLET | Freq: Every morning | ORAL | Status: DC

## 2015-03-25 MED ORDER — HYDROCHLOROTHIAZIDE 25 MG PO TABS: 25.0000 mg | ORAL_TABLET | Freq: Every day | ORAL | Status: DC

## 2015-03-25 NOTE — Patient Instructions (Signed)
Physical in 1 year Get fasting labs Get mammogram

## 2015-03-25 NOTE — Progress Notes (Signed)
Patient ID: Stacey CoveyRobin S Booth, female   DOB: [birth year], 39 y.o.   MRN: [id number] History of Present Illness: Stacey BallRobin is a 39 year old white female in for well woman gyn exam.She has had unusual pustules since December and was seen 5/17 and referred to Dr Renaldo ReelHuang, and treated with doxycycline with no improvement, she thinks it is related to all her meds.She had EGD and colonoscopy in 2015 and had meds changed.   Current Medications, Allergies, Past Medical History, Past Surgical History, Family History and Social History were reviewed in [facility] electronic medical record.     Review of Systems: Patient denies any headaches, hearing loss, fatigue, blurred vision, shortness of breath, chest pain, abdominal pain, problems with bowel movements, urination, or intercourse. No joint pain.She is happy with her estrace, and she sees therapists for a mood disorder.    Physical Exam:BP 110/72 mmHg  Pulse 76  Ht 5\' 6"  (1.676 m)  Wt 159 lb (72.122 kg)  BMI 25.68 kg/m2  LMP 11/02/2011 General:  Well developed, well nourished, no acute distress Skin:  Warm and dry, has circular areas over body, worse where sun hits,some with pus,raw areas and nails have pus that will ooze out, has areas in corner of mouth Neck:  Midline trachea, normal thyroid, good ROM, no lymphadenopathy Lungs; mild wheezing bilaterally Breast:  No dominant palpable mass, retraction, or nipple discharge Cardiovascular: Regular rate and rhythm Abdomen:  Soft, non tender, no hepatosplenomegaly Pelvic:  External genitalia is normal in appearance, no lesions.  The vagina is normal in appearance. Urethra has no lesions or masses. The cervix and uterus are absent.  No adnexal masses or tenderness noted.Bladder is non tender, no masses felt. Rectal: Good sphincter tone, no polyps, or hemorrhoids felt.  Hemoccult negative. Extremities/musculoskeletal:  No swelling or varicosities noted, no clubbing or cyanosis Psych:  Alert and  cooperative,seems happy, but tired of skin condition [facility] Dr Renaldo ReelHuang.  Impression: Well woman gyn exam no pap ET Pustules of undetermined etiology  Hypertension Sleep disturbance    Plan: Refilled ambien 10 mg #30 with 1 refill, take 1 at hs prn Refilled hydrodiuril 25 mg #30 take 1 daily with 11 refills Refilled inderal 20 mg take 1 daily #30 with 11 refills Physical in 1 year Get mammogram now  Get fasting labs in near future,Check CBC,CMP,TSH and lipids Stay out of sun and keep appt with Dr Renaldo ReelHuang

## 2015-05-24 ENCOUNTER — Encounter: Payer: Self-pay | Admitting: Adult Health

## 2015-05-25 ENCOUNTER — Other Ambulatory Visit: Payer: Self-pay | Admitting: Adult Health

## 2015-05-25 MED ORDER — ZOLPIDEM TARTRATE 10 MG PO TABS: 10.0000 mg | ORAL_TABLET | Freq: Every evening | ORAL | Status: DC | PRN

## 2015-06-12 ENCOUNTER — Other Ambulatory Visit: Payer: Self-pay | Admitting: Adult Health

## 2015-06-12 ENCOUNTER — Encounter: Payer: Self-pay | Admitting: Adult Health

## 2015-06-12 MED ORDER — HYDROCHLOROTHIAZIDE 25 MG PO TABS: 25.0000 mg | ORAL_TABLET | Freq: Every day | ORAL | Status: DC

## 2015-06-14 ENCOUNTER — Other Ambulatory Visit: Payer: Self-pay | Admitting: Adult Health

## 2015-06-26 ENCOUNTER — Encounter: Payer: Self-pay | Admitting: Adult Health

## 2015-06-26 ENCOUNTER — Other Ambulatory Visit: Payer: Self-pay | Admitting: Adult Health

## 2015-06-29 ENCOUNTER — Other Ambulatory Visit: Payer: Self-pay | Admitting: Obstetrics & Gynecology

## 2015-07-15 ENCOUNTER — Other Ambulatory Visit: Payer: Self-pay | Admitting: Obstetrics & Gynecology

## 2015-08-04 ENCOUNTER — Other Ambulatory Visit: Payer: Self-pay | Admitting: Adult Health

## 2015-08-04 MED ORDER — ZOLPIDEM TARTRATE 10 MG PO TABS: 10.0000 mg | ORAL_TABLET | Freq: Every day | ORAL | Status: DC

## 2015-10-16 ENCOUNTER — Encounter: Payer: Self-pay | Admitting: Adult Health

## 2015-10-20 ENCOUNTER — Other Ambulatory Visit: Payer: Self-pay | Admitting: Adult Health

## 2015-10-20 MED ORDER — ZOLPIDEM TARTRATE 10 MG PO TABS: 10.0000 mg | ORAL_TABLET | Freq: Every day | ORAL | Status: DC

## 2015-10-30 ENCOUNTER — Other Ambulatory Visit: Payer: Self-pay | Admitting: Adult Health

## 2015-11-04 ENCOUNTER — Telehealth: Payer: Self-pay | Admitting: *Deleted

## 2015-11-04 NOTE — Telephone Encounter (Signed)
Pt's insurance company has denied covering Acyclovir 5% ointment. I tried to call pt to let her know but she didn't have a working number. I called [facility] in [place] to make them aware. Pt will have to pay out of pocket if she wants that ointment per JAG. Pharmacy to let pt know. JSY

## 2016-01-07 ENCOUNTER — Ambulatory Visit: Payer: Medicaid Other | Admitting: Adult Health

## 2016-01-11 ENCOUNTER — Ambulatory Visit (INDEPENDENT_AMBULATORY_CARE_PROVIDER_SITE_OTHER): Payer: Medicaid Other | Admitting: Adult Health

## 2016-01-11 ENCOUNTER — Encounter: Payer: Self-pay | Admitting: Adult Health

## 2016-01-11 ENCOUNTER — Other Ambulatory Visit: Payer: Self-pay | Admitting: Adult Health

## 2016-01-11 VITALS — BP 130/100 | HR 86 | Ht 67.0 in | Wt 193.5 lb

## 2016-01-11 DIAGNOSIS — R635 Abnormal weight gain: Secondary | ICD-10-CM

## 2016-01-11 DIAGNOSIS — I10 Essential (primary) hypertension: Secondary | ICD-10-CM

## 2016-01-11 DIAGNOSIS — Z79899 Other long term (current) drug therapy: Secondary | ICD-10-CM

## 2016-01-11 DIAGNOSIS — Z139 Encounter for screening, unspecified: Secondary | ICD-10-CM

## 2016-01-11 DIAGNOSIS — G479 Sleep disorder, unspecified: Secondary | ICD-10-CM | POA: Diagnosis not present

## 2016-01-11 DIAGNOSIS — R1032 Left lower quadrant pain: Secondary | ICD-10-CM | POA: Diagnosis not present

## 2016-01-11 MED ORDER — ZOLPIDEM TARTRATE 10 MG PO TABS: 10.0000 mg | ORAL_TABLET | Freq: Every day | ORAL | Status: DC

## 2016-01-11 MED ORDER — LISINOPRIL 10 MG PO TABS: 10.0000 mg | ORAL_TABLET | Freq: Every day | ORAL | Status: DC

## 2016-01-11 NOTE — Progress Notes (Signed)
Subjective:     Patient ID: Stacey Booth, female   DOB: December 18, 1975, 40 y.o.   MRN: [id number]  HPI Stacey Booth is a 40 year old white female in complaining of weight gain, BP up and retaining fluid and stomach hurts at times LUQ, and has to take meds to sleep, she is still going to pain clinic for neck and back pain and she has headaches at times. She needs PCP.   Review of Systems Patient denies any headaches, hearing loss, fatigue, blurred vision, shortness of breath, chest pain,  problems with bowel movements, urination, or intercourse. No joint pain or mood swings.See HPI for positives. Reviewed past medical,surgical, social and family history. Reviewed medications and allergies.     Objective:   Physical Exam BP 130/100 mmHg  Pulse 86  Ht 5\' 7"  (1.702 m)  Wt 193 lb 8 oz (87.771 kg)  BMI 30.30 kg/m2  LMP 11/02/2011 Skin warm and dry.  Lungs: clear to ausculation bilaterally. Cardiovascular: regular rate and rhythm.Abdomen soft and non tender today, no HSM noted, she has gained about 30 lbs since last year.      Assessment:    Hypertension Weight gain Sleep disturbance ET LUQ pain, chronic      Plan:    Check CBC,CMP,TSH and lipids, A1c and vitamin D  Rx lisinopril 10 mg take 1 daily #30 with 6 refills Rx ambien 10 mg #30 take 1 at hs prn sleep with 2 refills Follow up in 3 weeks to check BP Get PCP

## 2016-01-12 ENCOUNTER — Other Ambulatory Visit: Payer: Self-pay | Admitting: Adult Health

## 2016-01-12 ENCOUNTER — Telehealth: Payer: Self-pay | Admitting: Adult Health

## 2016-01-12 ENCOUNTER — Encounter: Payer: Self-pay | Admitting: Adult Health

## 2016-01-12 DIAGNOSIS — E559 Vitamin D deficiency, unspecified: Secondary | ICD-10-CM

## 2016-01-12 HISTORY — DX: Vitamin D deficiency, unspecified: E55.9

## 2016-01-12 LAB — COMPREHENSIVE METABOLIC PANEL
A/G RATIO: 1.7 (ref 1.2–2.2)
ALK PHOS: 72 IU/L (ref 39–117)
ALT: 14 IU/L (ref 0–32)
AST: 15 IU/L (ref 0–40)
Albumin: 4.4 g/dL (ref 3.5–5.5)
BUN/Creatinine Ratio: 14 (ref 9–23)
BUN: 12 mg/dL (ref 6–24)
Bilirubin Total: 0.2 mg/dL (ref 0.0–1.2)
CO2: 23 mmol/L (ref 18–29)
Calcium: 9.7 mg/dL (ref 8.7–10.2)
Chloride: 96 mmol/L (ref 96–106)
Creatinine, Ser: 0.86 mg/dL (ref 0.57–1.00)
GFR calc Af Amer: 97 mL/min/{1.73_m2} (ref >59)
GFR calc non Af Amer: 84 mL/min/{1.73_m2} (ref >59)
GLOBULIN, TOTAL: 2.6 g/dL (ref 1.5–4.5)
Glucose: 117 mg/dL — ABNORMAL HIGH (ref 65–99)
POTASSIUM: 4.7 mmol/L (ref 3.5–5.2)
SODIUM: 138 mmol/L (ref 134–144)
Total Protein: 7 g/dL (ref 6.0–8.5)

## 2016-01-12 LAB — LIPID PANEL
CHOLESTEROL TOTAL: 212 mg/dL — AB (ref 100–199)
Chol/HDL Ratio: 2.8 ratio units (ref 0.0–4.4)
HDL: 77 mg/dL (ref >39)
LDL Calculated: 91 mg/dL (ref 0–99)
Triglycerides: 220 mg/dL — ABNORMAL HIGH (ref 0–149)
VLDL Cholesterol Cal: 44 mg/dL — ABNORMAL HIGH (ref 5–40)

## 2016-01-12 LAB — CBC
HEMATOCRIT: 40.5 % (ref 34.0–46.6)
Hemoglobin: 13.9 g/dL (ref 11.1–15.9)
MCH: 31.5 pg (ref 26.6–33.0)
MCHC: 34.3 g/dL (ref 31.5–35.7)
MCV: 92 fL (ref 79–97)
Platelets: 349 10*3/uL (ref 150–379)
RBC: 4.41 x10E6/uL (ref 3.77–5.28)
RDW: 13.1 % (ref 12.3–15.4)
WBC: 13.8 10*3/uL — ABNORMAL HIGH (ref 3.4–10.8)

## 2016-01-12 LAB — HEMOGLOBIN A1C
Est. average glucose Bld gHb Est-mCnc: 114 mg/dL
HEMOGLOBIN A1C: 5.6 % (ref 4.8–5.6)

## 2016-01-12 LAB — TSH: TSH: 0.704 u[IU]/mL (ref 0.450–4.500)

## 2016-01-12 LAB — VITAMIN D 25 HYDROXY (VIT D DEFICIENCY, FRACTURES): VIT D 25 HYDROXY: 17 ng/mL — AB (ref 30.0–100.0)

## 2016-01-12 MED ORDER — CHOLECALCIFEROL 125 MCG (5000 UT) PO CAPS: 5000.0000 [IU] | ORAL_CAPSULE | Freq: Every day | ORAL | Status: AC

## 2016-01-12 NOTE — Telephone Encounter (Signed)
Take vitamin D 3 5000 iu every day, decrease carbs and increase activity

## 2016-02-01 ENCOUNTER — Ambulatory Visit: Payer: Medicaid Other | Admitting: Adult Health

## 2016-02-27 ENCOUNTER — Encounter (HOSPITAL_COMMUNITY): Payer: Self-pay | Admitting: Emergency Medicine

## 2016-02-27 ENCOUNTER — Emergency Department (HOSPITAL_COMMUNITY): Payer: Medicaid Other

## 2016-02-27 ENCOUNTER — Emergency Department (HOSPITAL_COMMUNITY)
Admission: EM | Admit: 2016-02-27 | Discharge: 2016-02-27 | Disposition: A | Discharge status: Home / Self Care | Payer: Medicaid Other | Attending: Emergency Medicine | Admitting: Emergency Medicine

## 2016-02-27 DIAGNOSIS — E876 Hypokalemia: Secondary | ICD-10-CM | POA: Insufficient documentation

## 2016-02-27 DIAGNOSIS — F17210 Nicotine dependence, cigarettes, uncomplicated: Secondary | ICD-10-CM | POA: Diagnosis not present

## 2016-02-27 DIAGNOSIS — M1990 Unspecified osteoarthritis, unspecified site: Secondary | ICD-10-CM | POA: Insufficient documentation

## 2016-02-27 DIAGNOSIS — F909 Attention-deficit hyperactivity disorder, unspecified type: Secondary | ICD-10-CM | POA: Diagnosis not present

## 2016-02-27 DIAGNOSIS — F329 Major depressive disorder, single episode, unspecified: Secondary | ICD-10-CM | POA: Diagnosis not present

## 2016-02-27 DIAGNOSIS — Z79899 Other long term (current) drug therapy: Secondary | ICD-10-CM | POA: Insufficient documentation

## 2016-02-27 DIAGNOSIS — E860 Dehydration: Secondary | ICD-10-CM | POA: Diagnosis not present

## 2016-02-27 DIAGNOSIS — I10 Essential (primary) hypertension: Secondary | ICD-10-CM | POA: Diagnosis not present

## 2016-02-27 DIAGNOSIS — R112 Nausea with vomiting, unspecified: Secondary | ICD-10-CM | POA: Diagnosis present

## 2016-02-27 LAB — COMPREHENSIVE METABOLIC PANEL
ALK PHOS: 61 U/L (ref 38–126)
ALT: 14 U/L (ref 14–54)
ANION GAP: 11 (ref 5–15)
AST: 17 U/L (ref 15–41)
Albumin: 4.2 g/dL (ref 3.5–5.0)
BILIRUBIN TOTAL: 0.2 mg/dL — AB (ref 0.3–1.2)
BUN: 10 mg/dL (ref 6–20)
CALCIUM: 9.6 mg/dL (ref 8.9–10.3)
CO2: 27 mmol/L (ref 22–32)
Chloride: 100 mmol/L — ABNORMAL LOW (ref 101–111)
Creatinine, Ser: 1.13 mg/dL — ABNORMAL HIGH (ref 0.44–1.00)
GFR calc non Af Amer: 60 mL/min — ABNORMAL LOW (ref >60)
Glucose, Bld: 103 mg/dL — ABNORMAL HIGH (ref 65–99)
POTASSIUM: 3.1 mmol/L — AB (ref 3.5–5.1)
Sodium: 138 mmol/L (ref 135–145)
TOTAL PROTEIN: 7.4 g/dL (ref 6.5–8.1)

## 2016-02-27 LAB — CBC WITH DIFFERENTIAL/PLATELET
BASOS PCT: 0 %
Basophils Absolute: 0 10*3/uL (ref 0.0–0.1)
EOS ABS: 0.2 10*3/uL (ref 0.0–0.7)
Eosinophils Relative: 2 %
HEMATOCRIT: 37.4 % (ref 36.0–46.0)
HEMOGLOBIN: 12.6 g/dL (ref 12.0–15.0)
LYMPHS ABS: 2.6 10*3/uL (ref 0.7–4.0)
Lymphocytes Relative: 26 %
MCH: 31.2 pg (ref 26.0–34.0)
MCHC: 33.7 g/dL (ref 30.0–36.0)
MCV: 92.6 fL (ref 78.0–100.0)
MONO ABS: 0.4 10*3/uL (ref 0.1–1.0)
MONOS PCT: 4 %
NEUTROS PCT: 68 %
Neutro Abs: 6.9 10*3/uL (ref 1.7–7.7)
Platelets: 301 10*3/uL (ref 150–400)
RBC: 4.04 MIL/uL (ref 3.87–5.11)
RDW: 12.8 % (ref 11.5–15.5)
WBC: 10.1 10*3/uL (ref 4.0–10.5)

## 2016-02-27 LAB — PREGNANCY, URINE: PREG TEST UR: NEGATIVE

## 2016-02-27 LAB — I-STAT TROPONIN, ED: TROPONIN I, POC: 0 ng/mL (ref 0.00–0.08)

## 2016-02-27 LAB — URINALYSIS, ROUTINE W REFLEX MICROSCOPIC
Bilirubin Urine: NEGATIVE
Glucose, UA: NEGATIVE mg/dL
KETONES UR: NEGATIVE mg/dL
LEUKOCYTES UA: NEGATIVE
NITRITE: NEGATIVE
PROTEIN: NEGATIVE mg/dL
Specific Gravity, Urine: 1.01 (ref 1.005–1.030)
pH: 5.5 (ref 5.0–8.0)

## 2016-02-27 LAB — URINE MICROSCOPIC-ADD ON: WBC UA: NONE SEEN WBC/hpf (ref 0–5)

## 2016-02-27 MED ORDER — SODIUM CHLORIDE 0.9 % IV BOLUS (SEPSIS)
2000.0000 mL | Freq: Once | INTRAVENOUS | Status: AC
  Administered 2016-02-27: 2000 mL via INTRAVENOUS

## 2016-02-27 MED ORDER — METOCLOPRAMIDE HCL 5 MG/ML IJ SOLN
10.0000 mg | Freq: Once | INTRAMUSCULAR | Status: AC
  Administered 2016-02-27: 10 mg via INTRAVENOUS
  Filled 2016-02-27: qty 2

## 2016-02-27 MED ORDER — POTASSIUM CHLORIDE CRYS ER 20 MEQ PO TBCR
40.0000 meq | EXTENDED_RELEASE_TABLET | Freq: Once | ORAL | Status: AC
  Administered 2016-02-27: 40 meq via ORAL
  Filled 2016-02-27: qty 2

## 2016-02-27 MED ORDER — KETOROLAC TROMETHAMINE 30 MG/ML IJ SOLN
30.0000 mg | Freq: Once | INTRAMUSCULAR | Status: AC
  Administered 2016-02-27: 30 mg via INTRAVENOUS
  Filled 2016-02-27: qty 1

## 2016-02-27 MED ORDER — METHYLPREDNISOLONE SODIUM SUCC 125 MG IJ SOLR
125.0000 mg | Freq: Once | INTRAMUSCULAR | Status: AC
  Administered 2016-02-27: 125 mg via INTRAVENOUS
  Filled 2016-02-27: qty 2

## 2016-02-27 NOTE — Discharge Instructions (Signed)
Dehydration, Adult Dehydration is a condition in which you do not have enough fluid or water in your body. It happens when you take in less fluid than you lose. Vital organs such as the kidneys, brain, and heart cannot function without a proper amount of fluids. Any loss of fluids from the body can cause dehydration.  Dehydration can range from mild to severe. This condition should be treated right away to help prevent it from becoming severe. CAUSES  This condition may be caused by:  Vomiting.  Diarrhea.  Excessive sweating, such as when exercising in hot or humid weather.  Not drinking enough fluid during strenuous exercise or during an illness.  Excessive urine output.  Fever.  Certain medicines. RISK FACTORS This condition is more likely to develop in:  People who are taking certain medicines that cause the body to lose excess fluid (diuretics).   People who have a chronic illness, such as diabetes, that may increase urination.  Older adults.   People who live at high altitudes.   People who participate in endurance sports.  SYMPTOMS  Mild Dehydration  Thirst.  Dry lips.  Slightly dry mouth.  Dry, warm skin. Moderate Dehydration  Very dry mouth.   Muscle cramps.   Dark urine and decreased urine production.   Decreased tear production.   Headache.   Light-headedness, especially when you stand up from a sitting position.  Severe Dehydration  Changes in skin.   Cold and clammy skin.   Skin does not spring back quickly when lightly pinched and released.   Changes in body fluids.   Extreme thirst.   No tears.   Not able to sweat when body temperature is high, such as in hot weather.   Minimal urine production.   Changes in vital signs.   Rapid, weak pulse (more than 100 beats per minute when you are sitting still).   Rapid breathing.   Low blood pressure.   Other changes.   Sunken eyes.   Cold hands and feet.    Confusion.  Lethargy and difficulty being awakened.  Fainting (syncope).   Short-term weight loss.   Unconsciousness. DIAGNOSIS  This condition may be diagnosed based on your symptoms. You may also have tests to determine how severe your dehydration is. These tests may include:   Urine tests.   Blood tests.  TREATMENT  Treatment for this condition depends on the severity. Mild or moderate dehydration can often be treated at home. Treatment should be started right away. Do not wait until dehydration becomes severe. Severe dehydration needs to be treated at the hospital. Treatment for Mild Dehydration  Drinking plenty of water to replace the fluid you have lost.   Replacing minerals in your blood (electrolytes) that you may have lost.  Treatment for Moderate Dehydration  Consuming oral rehydration solution (ORS). Treatment for Severe Dehydration  Receiving fluid through an IV tube.   Receiving electrolyte solution through a feeding tube that is passed through your nose and into your stomach (nasogastric tube or NG tube).  Correcting any abnormalities in electrolytes. HOME CARE INSTRUCTIONS   Drink enough fluid to keep your urine clear or pale yellow.   Drink water or fluid slowly by taking small sips. You can also try sucking on ice cubes.  Have food or beverages that contain electrolytes. Examples include bananas and sports drinks.  Take over-the-counter and prescription medicines only as told by your health care provider.   Prepare ORS according to the manufacturer's instructions. Take sips   of ORS every 5 minutes until your urine returns to normal.  If you have vomiting or diarrhea, continue to try to drink water, ORS, or both.   If you have diarrhea, avoid:   Beverages that contain caffeine.   Fruit juice.   Milk.   Carbonated soft drinks.  Do not take salt tablets. This can lead to the condition of having too much sodium in your body  (hypernatremia).  SEEK MEDICAL CARE IF:  You cannot eat or drink without vomiting.  You have had moderate diarrhea during a period of more than 24 hours.  You have a fever. SEEK IMMEDIATE MEDICAL CARE IF:   You have extreme thirst.  You have severe diarrhea.  You have not urinated in 6-8 hours, or you have urinated only a small amount of very dark urine.  You have shriveled skin.  You are dizzy, confused, or both.   This information is not intended to replace advice given to you by your health care provider. Make sure you discuss any questions you have with your health care provider.   Document Released: 10/10/2005 Document Revised: 07/01/2015 Document Reviewed: 02/25/2015 Elsevier Interactive Patient Education 2016 Elsevier Inc.   Hypokalemia Hypokalemia means that the amount of potassium in the blood is lower than normal.Potassium is a chemical, called an electrolyte, that helps regulate the amount of fluid in the body. It also stimulates muscle contraction and helps nerves function properly.Most of the body's potassium is inside of cells, and only a very small amount is in the blood. Because the amount in the blood is so small, minor changes can be life-threatening. CAUSES  Antibiotics.  Diarrhea or vomiting.  Using laxatives too much, which can cause diarrhea.  Chronic kidney disease.  Water pills (diuretics).  Eating disorders (bulimia).  Low magnesium level.  Sweating a lot. SIGNS AND SYMPTOMS  Weakness.  Constipation.  Fatigue.  Muscle cramps.  Mental confusion.  Skipped heartbeats or irregular heartbeat (palpitations).  Tingling or numbness. DIAGNOSIS  Your health care provider can diagnose hypokalemia with blood tests. In addition to checking your potassium level, your health care provider may also check other lab tests. TREATMENT Hypokalemia can be treated with potassium supplements taken by mouth or adjustments in your current medicines.  If your potassium level is very low, you may need to get potassium through a vein (IV) and be monitored in the hospital. A diet high in potassium is also helpful. Foods high in potassium are:  Nuts, such as peanuts and pistachios.  Seeds, such as sunflower seeds and pumpkin seeds.  Peas, lentils, and lima beans.  Whole grain and bran cereals and breads.  Fresh fruit and vegetables, such as apricots, avocado, bananas, cantaloupe, kiwi, oranges, tomatoes, asparagus, and potatoes.  Orange and tomato juices.  Red meats.  Fruit yogurt. HOME CARE INSTRUCTIONS  Take all medicines as prescribed by your health care provider.  Maintain a healthy diet by including nutritious food, such as fruits, vegetables, nuts, whole grains, and lean meats.  If you are taking a laxative, be sure to follow the directions on the label. SEEK MEDICAL CARE IF:  Your weakness gets worse.  You feel your heart pounding or racing.  You are vomiting or having diarrhea.  You are diabetic and having trouble keeping your blood glucose in the normal range. SEEK IMMEDIATE MEDICAL CARE IF:  You have chest pain, shortness of breath, or dizziness.  You are vomiting or having diarrhea for more than 2 days.  You faint. MAKE   SURE YOU:   Understand these instructions.  Will watch your condition.  Will get help right away if you are not doing well or get worse.   This information is not intended to replace advice given to you by your health care provider. Make sure you discuss any questions you have with your health care provider.   Document Released: 10/10/2005 Document Revised: 10/31/2014 Document Reviewed: 04/12/2013 Elsevier Interactive Patient Education 2016 Elsevier Inc.  

## 2016-02-27 NOTE — ED Notes (Signed)
Pt has multiple complaints including a cough starting Sunday, dizziness, headache, feeling shaky, and feeling like throat is closing at times.  Also states she cannot sleep and has an ingrown toenail.

## 2016-02-27 NOTE — ED Notes (Signed)
Pt resting comfortably. States she is starting to feel better.  Tolerated ginger ale and graham crackers.  Ambulating in hall. No dizziness. Headache improving. S 2nd liter bolus infusing

## 2016-02-27 NOTE — ED Provider Notes (Signed)
CSN: [id number]     Arrival date & time 02/27/16  0710 History   First MD Initiated Contact with Patient 02/27/16 [phone]     Chief Complaint  Patient presents with  . Illness     (Consider location/radiation/quality/duration/timing/severity/associated sxs/prior Treatment) HPI Patient presents with multiple complaints. States she developed dry cough one week ago and had that for several days. She then began having a gradual onset frontal headache with photophobia. She's also had nausea with several episodes of vomiting. She has intermittent chest tightness and shortness of breath that is associated with blurred vision. Denies any fever or chills. Also complains of left great toe pain. No known injury. She states she's been pain less than normal and thinks she is dehydrated. Past Medical History  Diagnosis Date  . PONV (postoperative nausea and vomiting)   . Renal disorder     kidney stones  . Arthritis   . ADD (attention deficit disorder)   . PTSD (post-traumatic stress disorder)   . Anxiety   . Depression   . Hypertension   . Vaginal dryness 02/18/2014  . IBS (irritable bowel syndrome) 03/19/2014  . Hematuria 06/02/2014  . History of kidney stones 06/02/2014  . Endometriosis     s/p hysterectomy  . GERD (gastroesophageal reflux disease)   . Sleep disturbance 09/05/2014  . Back pain   . Hip pain   . Current use of estrogen therapy 03/25/2015  . Neck injury   . Back injury   . Headache   . Vitamin D deficiency 01/12/2016   Past Surgical History  Procedure Laterality Date  . Cystoscopy  5-6 months ago    [place]  . Cholecystectomy    . Tubal ligation    . Tonsillectomy    . Back surgery      ruptured disc-laparoscopic repair  . Vaginal hysterectomy  11/30/2011    Procedure: HYSTERECTOMY VAGINAL;  Surgeon: Lazaro Arms, MD;  Location: [facility];  Service: Gynecology;  Laterality: N/A;  transvaginal hysterectomy with right salpingo-oophorectomy  . Salpingoophorectomy  11/30/2011   Procedure: SALPINGO OOPHERECTOMY;  Surgeon: Lazaro Arms, MD;  Location: [facility];  Service: Gynecology;  Laterality: Right;  . Endometrial ablation    . Colonoscopy N/A 06/26/2014    Dr. Rourk:normal ileocolonoscopy s/p biopsy. Negative biopsies  . Esophagogastroduodenoscopy N/A 06/26/2014    Dr. Ace Gins gastric mucosa-status post gastric biopsy, slight chronic gastritis without H.pylori  . Mouth surgery    . Abdominal hysterectomy     Family History  Problem Relation Age of Onset  . Anesthesia problems Neg Hx   . Hypotension Neg Hx   . Malignant hyperthermia Neg Hx   . Pseudochol deficiency Neg Hx   . Cancer Father   . Diabetes Father   . Heart attack Father   . Stroke Father   . Diabetes Maternal Grandmother   . Thyroid disease Mother   . Goiter Mother   . Schizophrenia Sister   . Bipolar disorder Sister   . ADD / ADHD Son   . Asperger's syndrome Son   . Anxiety disorder Son   . Autism spectrum disorder Son   . Sleep disorder Son   . Other Paternal Grandmother     eye disease  . Asthma Brother    Social History  Substance Use Topics  . Smoking status: Current Every Day Smoker -- 0.50 packs/day for 15 years    Types: Cigarettes  . Smokeless tobacco: Never Used     Comment: 1/2 pack daily  .  Alcohol Use: No   OB History    Gravida Para Term Preterm AB TAB SAB Ectopic Multiple Living   4 2   2  1 1  2      Review of Systems  Constitutional: Negative for fever, chills and fatigue.  HENT: Positive for trouble swallowing. Negative for facial swelling, sore throat and voice change.   Eyes: Positive for photophobia and visual disturbance.  Respiratory: Positive for cough, chest tightness and shortness of breath. Negative for wheezing.   Cardiovascular: Negative for chest pain, palpitations and leg swelling.  Gastrointestinal: Positive for nausea and vomiting. Negative for abdominal pain, diarrhea and constipation.  Genitourinary: Negative for dysuria, frequency,  hematuria, flank pain and difficulty urinating.  Musculoskeletal: Negative for myalgias, back pain, neck pain and neck stiffness.  Skin: Negative for rash and wound.  Neurological: Positive for dizziness, light-headedness and headaches. Negative for syncope, weakness and numbness.  All other systems reviewed and are negative.     Allergies  Gabapentin; Naproxen; and Penicillins  Home Medications   Prior to Admission medications   Medication Sig Start Date End Date Taking? Authorizing Provider  AMITIZA 8 MCG capsule Take 1 capsule by mouth 2 (two) times daily. 02/02/16  Yes Historical Provider, MD  Cholecalciferol 5000 units capsule Take 1 capsule (5,000 Units total) by mouth daily. 01/12/16  Yes Adline Potter, NP  estradiol (ESTRACE) 1 MG tablet TAKE 1 TABLET BY MOUTH TWICE DAILY 07/15/15  Yes Lazaro Arms, MD  hydrochlorothiazide (HYDRODIURIL) 25 MG tablet Take 1 tablet (25 mg total) by mouth daily. 06/12/15  Yes Adline Potter, NP  ibuprofen (ADVIL,MOTRIN) 200 MG tablet Take 800 mg by mouth 2 (two) times daily as needed for moderate pain. For pain   Yes Historical Provider, MD  lisinopril (PRINIVIL,ZESTRIL) 10 MG tablet Take 1 tablet (10 mg total) by mouth daily. 01/11/16  Yes Adline Potter, NP  oxyCODONE-acetaminophen (PERCOCET) 7.5-325 MG tablet TAKE 1 TABLET BY MOUTH FOUR TIMES DAILY AS NEEDED FOR PAIN. 12/10/15  Yes Historical Provider, MD  pantoprazole (PROTONIX) 40 MG tablet TAKE 1 TABLET(40 MG) BY MOUTH DAILY 01/12/16  Yes Adline Potter, NP  promethazine (PHENERGAN) 25 MG tablet TAKE 1 TABLET BY MOUTH EVERY 6 HOURS AS NEEDED FOR NAUSEA OR VOMITING 01/12/16  Yes Adline Potter, NP  propranolol (INDERAL) 20 MG tablet Take 1 tablet (20 mg total) by mouth every morning. 03/25/15  Yes Adline Potter, NP  tiZANidine (ZANAFLEX) 4 MG tablet Take 1 tablet by mouth every 8 (eight) hours as needed for muscle spasms.  02/02/16  Yes Historical Provider, MD  zolpidem (AMBIEN)  10 MG tablet Take 1 tablet (10 mg total) by mouth at bedtime. 01/11/16  Yes Adline Potter, NP   BP 115/65 mmHg  Pulse 87  Temp(Src) 97.5 F (36.4 C) (Oral)  Resp 17  Ht 5\' 7"  (1.702 m)  Wt 196 lb (88.905 kg)  BMI 30.69 kg/m2  SpO2 99%  LMP 11/02/2011 Physical Exam  Constitutional: She is oriented to person, place, and time. She appears well-developed and well-nourished. No distress.  Anxious appearing  HENT:  Head: Normocephalic and atraumatic.  Tenderness to palpation over the bilateral frontal sinuses. Mucus appears mildly dry  Eyes: EOM are normal. Pupils are equal, round, and reactive to light.  No nystagmus  Neck: Normal range of motion. Neck supple.  No meningismus  Cardiovascular: Regular rhythm.  Exam reveals no gallop and no friction rub.   No murmur heard. Tachycardia  Pulmonary/Chest: Effort normal and breath sounds normal. No stridor. No respiratory distress. She has no wheezes. She has no rales. She exhibits no tenderness.  Abdominal: Soft. Bowel sounds are normal. She exhibits no distension and no mass. There is no tenderness. There is no rebound and no guarding.  Musculoskeletal: Normal range of motion. She exhibits no edema or tenderness.  No midline thoracic or lumbar tenderness. No CVA tenderness bilaterally. No lower extremity swelling, asymmetry or tenderness. Patient does have erythema and tenderness to the medial surface of the left great toe. No masses or fluctuance appreciated.  Neurological: She is alert and oriented to person, place, and time.  5/5 motor in all extremities. Sensation is fully intact.  Skin: Skin is warm and dry. No rash noted. No erythema.  Psychiatric:  Anxious  Nursing note and vitals reviewed.   ED Course  Procedures (including critical care time) Labs Review Labs Reviewed  COMPREHENSIVE METABOLIC PANEL - Abnormal; Notable for the following:    Potassium 3.1 (*)    Chloride 100 (*)    Glucose, Bld 103 (*)    Creatinine,  Ser 1.13 (*)    Total Bilirubin 0.2 (*)    GFR calc non Af Amer 60 (*)    All other components within normal limits  URINALYSIS, ROUTINE W REFLEX MICROSCOPIC (NOT AT [facility]) - Abnormal; Notable for the following:    Hgb urine dipstick TRACE (*)    All other components within normal limits  URINE MICROSCOPIC-ADD ON - Abnormal; Notable for the following:    Squamous Epithelial / LPF 0-5 (*)    Bacteria, UA FEW (*)    All other components within normal limits  CBC WITH DIFFERENTIAL/PLATELET  PREGNANCY, URINE  I-STAT TROPOININ, ED    Imaging Review Dg Chest 2 View  02/27/2016  CLINICAL DATA:  Cough and congestion for 6 days EXAM: CHEST  2 VIEW COMPARISON:  April 10, 2010 FINDINGS: Lungs are clear. Heart size and pulmonary vascularity are normal. No adenopathy. No bone lesions. IMPRESSION: No edema or consolidation. Electronically Signed   By: Bretta BangWilliam  Woodruff III M.D.   On: 02/27/2016 08:30   I have personally reviewed and evaluated these images and lab results as part of my medical decision-making.   EKG Interpretation   Date/Time:  Saturday Feb 27 2016 08:05:34 EDT Ventricular Rate:  91 PR Interval:  152 QRS Duration: 91 QT Interval:  366 QTC Calculation: 450 R Axis:   47 Text Interpretation:  Sinus rhythm Confirmed by Ranae PalmsYELVERTON  MD, Errick Salts  ([id number]) on 02/27/2016 10:27:52 AM      MDM   Final diagnoses:  Dehydration  Hypokalemia   Patient states she is feeling much better after IV fluids and potassium. Ambulating around the department.     Loren Raceravid Charell Faulk, MD 02/27/16 1122

## 2016-02-27 NOTE — ED Notes (Signed)
IV cath dc intact. Pt verbalized understanding of dc instructions and follow up care. Pt left and forgot paperwork.

## 2016-03-07 ENCOUNTER — Encounter: Payer: Self-pay | Admitting: Adult Health

## 2016-03-07 ENCOUNTER — Ambulatory Visit (INDEPENDENT_AMBULATORY_CARE_PROVIDER_SITE_OTHER): Payer: Medicaid Other | Admitting: Adult Health

## 2016-03-07 VITALS — BP 132/80 | HR 98 | Ht 67.0 in | Wt 198.5 lb

## 2016-03-07 DIAGNOSIS — G909 Disorder of the autonomic nervous system, unspecified: Secondary | ICD-10-CM | POA: Diagnosis not present

## 2016-03-07 DIAGNOSIS — I10 Essential (primary) hypertension: Secondary | ICD-10-CM | POA: Diagnosis not present

## 2016-03-07 NOTE — Patient Instructions (Addendum)
Will refer to cardiology Continue lisinopril Follow up prn

## 2016-03-07 NOTE — Progress Notes (Signed)
Subjective:     Patient ID: Stacey Booth, female   DOB: 1975/11/15, 40 y.o.   MRN: [id number]  HPI [name] is a 40 year old white female back in for BP check and review tests she had at pain clinic.She is complaining of being tired and has swelling in feet,ankles and hands at time and chest feels tight at times, too, "just does not feel good".   Review of Systems Patient denies any headaches, hearing loss, blurred vision, shortness of breath,  abdominal pain, problems with bowel movements, urination, or intercourse. No joint pain or mood swings.She HPI for positives. Reviewed past medical,surgical, social and family history. Reviewed medications and allergies.     Objective:   Physical Exam BP 132/80 mmHg  Pulse 98  Ht 5\' 7"  (1.702 m)  Wt 198 lb 8 oz (90.039 kg)  BMI 31.08 kg/m2  LMP 11/02/2011 Skin warm and dry. Lungs: clear to ausculation bilaterally. Cardiovascular: regular rate and rhythm.   Reviewed tests she had at pain clinic, will refer to cardiology,has ?autonomic dysfunction.   Assessment:     Hypertension  Autonomic dysfunction     Plan:     Refer to cardiology Continue lisinopril Follow up prn

## 2016-03-08 ENCOUNTER — Ambulatory Visit (INDEPENDENT_AMBULATORY_CARE_PROVIDER_SITE_OTHER): Payer: Medicaid Other | Admitting: Cardiology

## 2016-03-08 ENCOUNTER — Encounter: Payer: Self-pay | Admitting: Cardiology

## 2016-03-08 VITALS — BP 122/70 | HR 84 | Ht 67.0 in | Wt 198.0 lb

## 2016-03-08 DIAGNOSIS — R0789 Other chest pain: Secondary | ICD-10-CM

## 2016-03-08 DIAGNOSIS — R600 Localized edema: Secondary | ICD-10-CM

## 2016-03-08 DIAGNOSIS — R0602 Shortness of breath: Secondary | ICD-10-CM

## 2016-03-08 NOTE — Patient Instructions (Signed)
Medication Instructions:  Your physician recommends that you continue on your current medications as directed. Please refer to the Current Medication list given to you today.   Labwork: NONE  Testing/Procedures: Your physician has requested that you have an echocardiogram. Echocardiography is a painless test that uses sound waves to create images of your heart. It provides your doctor with information about the size and shape of your heart and how well your heart's chambers and valves are working. This procedure takes approximately one hour. There are no restrictions for this procedure.    Follow-Up: Your physician recommends that you schedule a follow-up appointment in: 4 WEEKS WITH DR. BRANCH.  Any Other Special Instructions Will Be Listed Below (If Applicable).     If you need a refill on your cardiac medications before your next appointment, please call your pharmacy.

## 2016-03-08 NOTE — Progress Notes (Signed)
Patient ID: CACHE DECOURSEY, female   DOB: 1976/02/27, 40 y.o.   MRN: [id number]     Clinical Summary Ms. Mink is a 40 y.o.female seen today as a new patient for the following medical problems. She is referred by NP Cyril Mourning.  1. LE edema - started about 6 months ago, increased over the last 1 month. Bilateral swelling, worst with standing. Can have some purple discoloration - notes some SOB/DOE at 1/2 block. Can have some orthopnea, sleeps at angle over the last 6 months. Reports 30 lbs weight gain in 6 months.   2. Chests pain - can have some chest tightness. Started few months ago. Mainly stress related. Occurs in midchest, can radiate to left chest. 3-5/10 in severity, can feel flushed, feels anxious. Different from her previous anxiety/panic attacks in the past  3. Autonomic insufficiency - followed in pain clinic at [facility]. She reports having autonomic testing done every few months at this clinic.  - can have some dizziness with standing up quickly, otherwise no significant symptoms    Past Medical History  Diagnosis Date  . PONV (postoperative nausea and vomiting)   . Renal disorder     kidney stones  . Arthritis   . ADD (attention deficit disorder)   . PTSD (post-traumatic stress disorder)   . Anxiety   . Depression   . Hypertension   . Vaginal dryness 02/18/2014  . IBS (irritable bowel syndrome) 03/19/2014  . Hematuria 06/02/2014  . History of kidney stones 06/02/2014  . Endometriosis     s/p hysterectomy  . GERD (gastroesophageal reflux disease)   . Sleep disturbance 09/05/2014  . Back pain   . Hip pain   . Current use of estrogen therapy 03/25/2015  . Neck injury   . Back injury   . Headache   . Vitamin D deficiency 01/12/2016  . Pinched nerve     and bulging discs     Allergies  Allergen Reactions  . Other Anaphylaxis    Epidural shots-severe pain, swelling, rash, throat closing up  . Gabapentin Other (See Comments)    BP and heart rate  elevated  . Naproxen Other (See Comments)    Makes mouth raw, stomach pain  . Penicillins Hives    Has patient had a PCN reaction causing immediate rash, facial/tongue/throat swelling, SOB or lightheadedness with hypotension: Yes Has patient had a PCN reaction causing severe rash involving mucus membranes or skin necrosis: No Has patient had a PCN reaction that required hospitalization No Has patient had a PCN reaction occurring within the last 10 years: No If all of the above answers are "NO", then may proceed with Cephalosporin use.      Current Outpatient Prescriptions  Medication Sig Dispense Refill  . Cholecalciferol 5000 units capsule Take 1 capsule (5,000 Units total) by mouth daily.    Marland Kitchen estradiol (ESTRACE) 1 MG tablet TAKE 1 TABLET BY MOUTH TWICE DAILY 60 tablet 11  . hydrochlorothiazide (HYDRODIURIL) 25 MG tablet Take 1 tablet (25 mg total) by mouth daily. 30 tablet 11  . ibuprofen (ADVIL,MOTRIN) 200 MG tablet Take 800 mg by mouth 2 (two) times daily as needed for moderate pain. For pain    . lisinopril (PRINIVIL,ZESTRIL) 10 MG tablet Take 1 tablet (10 mg total) by mouth daily. 30 tablet 6  . oxyCODONE-acetaminophen (PERCOCET) 7.5-325 MG tablet TAKE 1 TABLET BY MOUTH FOUR TIMES DAILY AS NEEDED FOR PAIN.  0  . pantoprazole (PROTONIX) 40 MG tablet TAKE 1  TABLET(40 MG) BY MOUTH DAILY 30 tablet 6  . promethazine (PHENERGAN) 25 MG tablet TAKE 1 TABLET BY MOUTH EVERY 6 HOURS AS NEEDED FOR NAUSEA OR VOMITING 30 tablet 1  . propranolol (INDERAL) 20 MG tablet Take 1 tablet (20 mg total) by mouth every morning. 30 tablet 11  . tiZANidine (ZANAFLEX) 4 MG tablet Take 1 tablet by mouth every 8 (eight) hours as needed for muscle spasms.   0  . zolpidem (AMBIEN) 10 MG tablet Take 1 tablet (10 mg total) by mouth at bedtime. 30 tablet 2   No current facility-administered medications for this visit.     Past Surgical History  Procedure Laterality Date  . Cystoscopy  5-6 months ago     [place]  . Cholecystectomy    . Tubal ligation    . Tonsillectomy    . Back surgery      ruptured disc-laparoscopic repair  . Vaginal hysterectomy  11/30/2011    Procedure: HYSTERECTOMY VAGINAL;  Surgeon: Lazaro ArmsLuther H Eure, MD;  Location: [facility];  Service: Gynecology;  Laterality: N/A;  transvaginal hysterectomy with right salpingo-oophorectomy  . Salpingoophorectomy  11/30/2011    Procedure: SALPINGO OOPHERECTOMY;  Surgeon: Lazaro ArmsLuther H Eure, MD;  Location: [facility];  Service: Gynecology;  Laterality: Right;  . Endometrial ablation    . Colonoscopy N/A 06/26/2014    Dr. Rourk:normal ileocolonoscopy s/p biopsy. Negative biopsies  . Esophagogastroduodenoscopy N/A 06/26/2014    Dr. Ace Ginsourk:Abnormal gastric mucosa-status post gastric biopsy, slight chronic gastritis without H.pylori  . Mouth surgery    . Abdominal hysterectomy       Allergies  Allergen Reactions  . Other Anaphylaxis    Epidural shots-severe pain, swelling, rash, throat closing up  . Gabapentin Other (See Comments)    BP and heart rate elevated  . Naproxen Other (See Comments)    Makes mouth raw, stomach pain  . Penicillins Hives    Has patient had a PCN reaction causing immediate rash, facial/tongue/throat swelling, SOB or lightheadedness with hypotension: Yes Has patient had a PCN reaction causing severe rash involving mucus membranes or skin necrosis: No Has patient had a PCN reaction that required hospitalization No Has patient had a PCN reaction occurring within the last 10 years: No If all of the above answers are "NO", then may proceed with Cephalosporin use.       Family History  Problem Relation Age of Onset  . Anesthesia problems Neg Hx   . Hypotension Neg Hx   . Malignant hyperthermia Neg Hx   . Pseudochol deficiency Neg Hx   . Cancer Father   . Diabetes Father   . Heart attack Father   . Stroke Father   . Diabetes Maternal Grandmother   . Thyroid disease Mother   . Goiter Mother   . Schizophrenia Sister   .  Bipolar disorder Sister   . ADD / ADHD Son   . Asperger's syndrome Son   . Anxiety disorder Son   . Autism spectrum disorder Son   . Sleep disorder Son   . Other Paternal Grandmother     eye disease  . Asthma Brother      Social History Ms. Anne reports that she has been smoking Cigarettes.  She has a 10 pack-year smoking history. She has never used smokeless tobacco. Ms. Jola Babinskireece reports that she does not drink alcohol.   Review of Systems CONSTITUTIONAL: No weight loss, fever, chills, weakness or fatigue.  HEENT: Eyes: No visual loss, blurred vision, double vision or  yellow sclerae.No hearing loss, sneezing, congestion, runny nose or sore throat.  SKIN: No rash or itching.  CARDIOVASCULAR: per HPI RESPIRATORY: No shortness of breath, cough or sputum.  GASTROINTESTINAL: No anorexia, nausea, vomiting or diarrhea. No abdominal pain or blood.  GENITOURINARY: No burning on urination, no polyuria NEUROLOGICAL: No headache, dizziness, syncope, paralysis, ataxia, numbness or tingling in the extremities. No change in bowel or bladder control.  MUSCULOSKELETAL: No muscle, back pain, joint pain or stiffness.  LYMPHATICS: No enlarged nodes. No history of splenectomy.  PSYCHIATRIC: No history of depression or anxiety.  ENDOCRINOLOGIC: No reports of sweating, cold or heat intolerance. No polyuria or polydipsia.  Marland Kitchen   Physical Examination Filed Vitals:   03/08/16 1027  BP: 122/70  Pulse: 84   Filed Vitals:   03/08/16 1027  Height:  (1.702 m)  Weight: 198 lb (89.812 kg)     Gen: resting comfortably, no acute distress HEENT: no scleral icterus, pupils equal round and reactive, no palptable cervical adenopathy,  CV: RRR, no m/r/g, no jvd Resp: Clear to auscultation bilaterally GI: abdomen is soft, non-tender, non-distended, normal bowel sounds, no hepatosplenomegaly MSK: extremities are warm, no edema.  Skin: warm, no rash Neuro:  no focal deficits Psych: appropriate  affect     Assessment and Plan  1. LE edema - unclear etiology, she also reports symptoms of SOB and orthopnea  - we will obtain an echo to further evaluate for possible cardiac etiology   2. Chest pain - atypical for cardiac chest pain, we will f/u echo results for now. Will not pursue ischemic testing at this time  3. Autonomic insufficiency - she reports history of autnomic insufficiency, and has had regular testing she reports at her pain clinic. I am unclear on the exact history and the indications for the recurrent tests she has been receiving, perhaps somehow related to her previous back surgeries. He orthostatics are normal in clinic today, I do not see any indication for therapy.   F/u 4 weeks. Request records from [facility].       Antoine Poche, M.D.

## 2016-03-16 ENCOUNTER — Ambulatory Visit (HOSPITAL_COMMUNITY): Payer: Medicaid Other | Attending: Cardiology

## 2016-04-11 ENCOUNTER — Other Ambulatory Visit: Payer: Self-pay | Admitting: Adult Health

## 2016-04-13 ENCOUNTER — Telehealth: Payer: Self-pay | Admitting: Cardiology

## 2016-04-13 ENCOUNTER — Encounter: Payer: Medicaid Other | Admitting: Cardiology

## 2016-04-13 NOTE — Telephone Encounter (Signed)
Patient had appointment today with Dr. Wyline MoodBranch. Dr. Wyline MoodBranch requested that we re-schedule due to the fact she has not had her echo. Called patient to re-schedule, however we were disconnected.  Tried calling patient back but went to a message stating VM has not been set up.

## 2016-04-13 NOTE — Telephone Encounter (Signed)
Agree with phone note

## 2016-04-13 NOTE — Progress Notes (Signed)
Clinical Summary [name] is a 40 y.o.female  1. LE edema - started about 6 months ago, increased over the last 1 month. Bilateral swelling, worst with standing. Can have some purple discoloration - notes some SOB/DOE at 1/2 block. Can have some orthopnea, sleeps at angle over the last 6 months. Reports 30 lbs weight gain in 6 months.   2. Chests pain - can have some chest tightness. Started few months ago. Mainly stress related. Occurs in midchest, can radiate to left chest. 3-5/10 in severity, can feel flushed, feels anxious. Different from her previous anxiety/panic attacks in the past  3. Autonomic insufficiency - followed in [facility] at [facility]. She reports having autonomic testing done every few months at this [facility].  - can have some dizziness with standing up quickly, otherwise no significant symptoms    Past Medical History  Diagnosis Date  . PONV (postoperative nausea and vomiting)   . Renal disorder     kidney stones  . Arthritis   . ADD (attention deficit disorder)   . PTSD (post-traumatic stress disorder)   . Anxiety   . Depression   . Hypertension   . Vaginal dryness 02/18/2014  . IBS (irritable bowel syndrome) 03/19/2014  . Hematuria 06/02/2014  . History of kidney stones 06/02/2014  . Endometriosis     s/p hysterectomy  . GERD (gastroesophageal reflux disease)   . Sleep disturbance 09/05/2014  . Back pain   . Hip pain   . Current use of estrogen therapy 03/25/2015  . Neck injury   . Back injury   . Headache   . Vitamin D deficiency 01/12/2016  . Pinched nerve     and bulging discs     Allergies  Allergen Reactions  . Other Anaphylaxis    Epidural shots-severe pain, swelling, rash, throat closing up  . Gabapentin Other (See Comments)    BP and heart rate elevated  . Naproxen Other (See Comments)    Makes mouth raw, stomach pain  . Penicillins Hives    Has patient had a PCN reaction causing immediate rash, facial/tongue/throat  swelling, SOB or lightheadedness with hypotension: Yes Has patient had a PCN reaction causing severe rash involving mucus membranes or skin necrosis: No Has patient had a PCN reaction that required hospitalization No Has patient had a PCN reaction occurring within the last 10 years: No If all of the above answers are "NO", then may proceed with Cephalosporin use.      Current Outpatient Prescriptions  Medication Sig Dispense Refill  . Cholecalciferol 5000 units capsule Take 1 capsule (5,000 Units total) by mouth daily.    Marland Kitchen estradiol (ESTRACE) 1 MG tablet TAKE 1 TABLET BY MOUTH TWICE DAILY 60 tablet 11  . hydrochlorothiazide (HYDRODIURIL) 25 MG tablet Take 1 tablet (25 mg total) by mouth daily. 30 tablet 11  . ibuprofen (ADVIL,MOTRIN) 200 MG tablet Take 800 mg by mouth 2 (two) times daily as needed for moderate pain. For pain    . lisinopril (PRINIVIL,ZESTRIL) 10 MG tablet Take 1 tablet (10 mg total) by mouth daily. 30 tablet 6  . oxyCODONE-acetaminophen (PERCOCET) 7.5-325 MG tablet TAKE 1 TABLET BY MOUTH FOUR TIMES DAILY AS NEEDED FOR PAIN.  0  . pantoprazole (PROTONIX) 40 MG tablet TAKE 1 TABLET(40 MG) BY MOUTH DAILY 30 tablet 6  . promethazine (PHENERGAN) 25 MG tablet TAKE 1 TABLET BY MOUTH EVERY 6 HOURS AS NEEDED FOR NAUSEA OR VOMITING 30 tablet 0  . propranolol (  INDERAL) 20 MG tablet Take 1 tablet (20 mg total) by mouth every morning. 30 tablet 11  . tiZANidine (ZANAFLEX) 4 MG tablet Take 1 tablet by mouth every 8 (eight) hours as needed for muscle spasms.   0  . zolpidem (AMBIEN) 10 MG tablet Take 1 tablet (10 mg total) by mouth at bedtime. 30 tablet 2   No current facility-administered medications for this visit.     Past Surgical History  Procedure Laterality Date  . Cystoscopy  5-6 months ago    [place]  . Cholecystectomy    . Tubal ligation    . Tonsillectomy    . Back surgery      ruptured disc-laparoscopic repair  . Vaginal hysterectomy  11/30/2011    Procedure:  HYSTERECTOMY VAGINAL;  Surgeon: Lazaro ArmsLuther H Eure, MD;  Location: [facility];  Service: Gynecology;  Laterality: N/A;  transvaginal hysterectomy with right salpingo-oophorectomy  . Salpingoophorectomy  11/30/2011    Procedure: SALPINGO OOPHERECTOMY;  Surgeon: Lazaro ArmsLuther H Eure, MD;  Location: [facility];  Service: Gynecology;  Laterality: Right;  . Endometrial ablation    . Colonoscopy N/A 06/26/2014    [facility]:normal ileocolonoscopy s/p biopsy. Negative biopsies  . Esophagogastroduodenoscopy N/A 06/26/2014    [facility]:Abnormal gastric mucosa-status post gastric biopsy, slight chronic gastritis without H.pylori  . Mouth surgery    . Abdominal hysterectomy       Allergies  Allergen Reactions  . Other Anaphylaxis    Epidural shots-severe pain, swelling, rash, throat closing up  . Gabapentin Other (See Comments)    BP and heart rate elevated  . Naproxen Other (See Comments)    Makes mouth raw, stomach pain  . Penicillins Hives    Has patient had a PCN reaction causing immediate rash, facial/tongue/throat swelling, SOB or lightheadedness with hypotension: Yes Has patient had a PCN reaction causing severe rash involving mucus membranes or skin necrosis: No Has patient had a PCN reaction that required hospitalization No Has patient had a PCN reaction occurring within the last 10 years: No If all of the above answers are "NO", then may proceed with Cephalosporin use.       Family History  Problem Relation Age of Onset  . Anesthesia problems Neg Hx   . Hypotension Neg Hx   . Malignant hyperthermia Neg Hx   . Pseudochol deficiency Neg Hx   . Cancer Father   . Diabetes Father   . Heart attack Father   . Stroke Father   . Diabetes Maternal Grandmother   . Thyroid disease Mother   . Goiter Mother   . Schizophrenia Sister   . Bipolar disorder Sister   . ADD / ADHD Son   . Asperger's syndrome Son   . Anxiety disorder Son   . Autism spectrum disorder Son   . Sleep disorder Son   . Other Paternal  Grandmother     eye disease  . Asthma Brother      Social History [facility] reports that she has been smoking Cigarettes.  She has a 10 pack-year smoking history. She has never used smokeless tobacco. Ms. Jola Babinskireece reports that she does not drink alcohol.   Review of Systems CONSTITUTIONAL: No weight loss, fever, chills, weakness or fatigue.  HEENT: Eyes: No visual loss, blurred vision, double vision or yellow sclerae.No hearing loss, sneezing, congestion, runny nose or sore throat.  SKIN: No rash or itching.  CARDIOVASCULAR:  RESPIRATORY: No shortness of breath, cough or sputum.  GASTROINTESTINAL: No anorexia, nausea, vomiting or  diarrhea. No abdominal pain or blood.  GENITOURINARY: No burning on urination, no polyuria NEUROLOGICAL: No headache, dizziness, syncope, paralysis, ataxia, numbness or tingling in the extremities. No change in bowel or bladder control.  MUSCULOSKELETAL: No muscle, back pain, joint pain or stiffness.  LYMPHATICS: No enlarged nodes. No history of splenectomy.  PSYCHIATRIC: No history of depression or anxiety.  ENDOCRINOLOGIC: No reports of sweating, cold or heat intolerance. No polyuria or polydipsia.  Marland Kitchen   Physical Examination There were no vitals filed for this visit. There were no vitals filed for this visit.  Gen: resting comfortably, no acute distress HEENT: no scleral icterus, pupils equal round and reactive, no palptable cervical adenopathy,  CV Resp: Clear to auscultation bilaterally GI: abdomen is soft, non-tender, non-distended, normal bowel sounds, no hepatosplenomegaly MSK: extremities are warm, no edema.  Skin: warm, no rash Neuro:  no focal deficits Psych: appropriate affect   Diagnostic Studies     Assessment and Plan  1. LE edema - unclear etiology, she also reports symptoms of SOB and orthopnea - we will obtain an echo to further evaluate for possible cardiac etiology   2. Chest pain - atypical for cardiac chest pain, we  will f/u echo results for now. Will not pursue ischemic testing at this time  3. Autonomic insufficiency - she reports history of autnomic insufficiency, and has had regular testing she reports at her pain [facility]. I am unclear on the exact history and the indications for the recurrent tests she has been receiving, perhaps somehow related to her previous back surgeries. He orthostatics are normal in [facility] today, I do not see any indication for therapy.   F/u 4 weeks. Request records from [facility].       Antoine Poche, M.D., F.A.C.C.

## 2016-04-19 ENCOUNTER — Other Ambulatory Visit: Payer: Self-pay | Admitting: Adult Health

## 2016-05-16 ENCOUNTER — Ambulatory Visit: Payer: Medicaid Other | Admitting: Adult Health

## 2016-05-16 ENCOUNTER — Encounter: Payer: Self-pay | Admitting: Adult Health

## 2016-06-06 ENCOUNTER — Other Ambulatory Visit: Payer: Self-pay | Admitting: Adult Health

## 2016-06-13 ENCOUNTER — Other Ambulatory Visit: Payer: Self-pay | Admitting: Adult Health

## 2016-07-07 NOTE — Progress Notes (Signed)
This encounter was created in error - please disregard.

## 2016-07-10 ENCOUNTER — Other Ambulatory Visit: Payer: Self-pay | Admitting: Adult Health

## 2016-07-13 ENCOUNTER — Other Ambulatory Visit: Payer: Self-pay | Admitting: Obstetrics & Gynecology

## 2016-08-08 ENCOUNTER — Other Ambulatory Visit: Payer: Self-pay | Admitting: Adult Health

## 2016-08-10 ENCOUNTER — Other Ambulatory Visit: Payer: Self-pay | Admitting: Adult Health

## 2016-08-26 ENCOUNTER — Encounter: Payer: Self-pay | Admitting: Obstetrics and Gynecology

## 2016-08-26 ENCOUNTER — Ambulatory Visit (INDEPENDENT_AMBULATORY_CARE_PROVIDER_SITE_OTHER): Payer: Medicaid Other | Admitting: Obstetrics and Gynecology

## 2016-08-26 VITALS — BP 130/80 | HR 98 | Ht 67.0 in | Wt 196.0 lb

## 2016-08-26 DIAGNOSIS — R21 Rash and other nonspecific skin eruption: Secondary | ICD-10-CM | POA: Diagnosis not present

## 2016-08-26 MED ORDER — LORATADINE-PSEUDOEPHEDRINE ER 5-120 MG PO TB12: 1.0000 | ORAL_TABLET | Freq: Two times a day (BID) | ORAL | 2 refills | Status: DC

## 2016-08-26 NOTE — Progress Notes (Signed)
[facility] Visit  08/26/2016           Patient name: [facility] MRN [id number]  Date of birth: 08/11/1976  CC & HPI:  [facility] is a 40 y.o. female presenting today for an intermittent rash to her bilateral thighs for the last 5 days.Pt states the rash appears when she is hot/flushed. She has applied benadryl cream without relief; notes rash only gets better when she is able to cool off.  She notes she removed a tick from the genital area a few weeks ago . She also notes h/o boils which she attributes to antibiotics taken in the past and nervous rash.   ROS:  ROS Otherwise negative for acute change except as noted in the HPI. Significant for ADD not currently on meds.  Pertinent History Reviewed:   Reviewed: Medical         Past Medical History:  Diagnosis Date  . ADD (attention deficit disorder)   . Anxiety   . Arthritis   . Back injury   . Back pain   . Current use of estrogen therapy 03/25/2015  . Depression   . Endometriosis    s/p hysterectomy  . GERD (gastroesophageal reflux disease)   . Headache   . Hematuria 06/02/2014  . Hip pain   . History of kidney stones 06/02/2014  . Hypertension   . IBS (irritable bowel syndrome) 03/19/2014  . Neck injury   . Pinched nerve    and bulging discs  . PONV (postoperative nausea and vomiting)   . PTSD (post-traumatic stress disorder)   . Renal disorder    kidney stones  . Sleep disturbance 09/05/2014  . Vaginal dryness 02/18/2014  . Vitamin D deficiency 01/12/2016                              Surgical Hx:    Past Surgical History:  Procedure Laterality Date  . ABDOMINAL HYSTERECTOMY    . BACK SURGERY     ruptured disc-laparoscopic repair  . CHOLECYSTECTOMY    . COLONOSCOPY N/A 06/26/2014   Dr. Rourk:normal ileocolonoscopy s/p biopsy. Negative biopsies  . CYSTOSCOPY  5-6 months ago   [place]  . ENDOMETRIAL ABLATION    . ESOPHAGOGASTRODUODENOSCOPY N/A 06/26/2014   Dr. Ace G[place]ourk:Abnormal gastric mucosa-status post  gastric biopsy, slight chronic gastritis without H.pylori  . MOUTH SURGERY    . SALPINGOOPHORECTOMY  11/30/2011   Procedure: SALPINGO OOPHERECTOMY;  Surgeon: Lazaro ArmsLuther H Eure, MD;  Location: [facility];  Service: Gynecology;  Laterality: Right;  . TONSILLECTOMY    . TUBAL LIGATION    . VAGINAL HYSTERECTOMY  11/30/2011   Procedure: HYSTERECTOMY VAGINAL;  Surgeon: Lazaro ArmsLuther H Eure, MD;  Location: [facility];  Service: Gynecology;  Laterality: N/A;  transvaginal hysterectomy with right salpingo-oophorectomy   Medications: Reviewed & Updated - see associated section                       Current Outpatient Prescriptions:  .  acetaminophen (TYLENOL) 325 MG tablet, Take 650 mg by mouth every 6 (six) hours as needed., Disp: , Rfl:  .  Cholecalciferol 5000 units capsule, Take 1 capsule (5,000 Units total) by mouth daily., Disp: , Rfl:  .  estradiol (ESTRACE) 1 MG tablet, TAKE 1 TABLET BY MOUTH TWICE DAILY, Disp: 60 tablet, Rfl: 11 .  hydrochlorothiazide (HYDRODIURIL) 25 MG tablet, TAKE 1 TABLET(25 MG) BY MOUTH DAILY,  Disp: 30 tablet, Rfl: 11 .  ibuprofen (ADVIL,MOTRIN) 200 MG tablet, Take 800 mg by mouth 2 (two) times daily as needed for moderate pain. For pain, Disp: , Rfl:  .  lisinopril (PRINIVIL,ZESTRIL) 10 MG tablet, TAKE 1 TABLET(10 MG) BY MOUTH DAILY, Disp: 30 tablet, Rfl: 6 .  pantoprazole (PROTONIX) 40 MG tablet, TAKE 1 TABLET(40 MG) BY MOUTH DAILY, Disp: 30 tablet, Rfl: 6 .  promethazine (PHENERGAN) 25 MG tablet, TAKE 1 TABLET BY MOUTH EVERY 6 HOURS AS NEEDED FOR NAUSEA OR VOMITING, Disp: 30 tablet, Rfl: 1 .  propranolol (INDERAL) 20 MG tablet, TAKE 1 TABLET(20 MG) BY MOUTH EVERY MORNING, Disp: 30 tablet, Rfl: 3 .  zolpidem (AMBIEN) 10 MG tablet, TAKE 1 TABLET BY MOUTH EVERY NIGHT AT BEDTIME, Disp: 30 tablet, Rfl: 1 .  oxyCODONE-acetaminophen (PERCOCET) 7.5-325 MG tablet, TAKE 1 TABLET BY MOUTH FOUR TIMES DAILY AS NEEDED FOR PAIN., Disp: , Rfl: 0 .  tiZANidine (ZANAFLEX) 4 MG tablet, Take 1 tablet by mouth  every 8 (eight) hours as needed for muscle spasms. , Disp: , Rfl: 0   Social History: Reviewed -  reports that she has been smoking Cigarettes.  She has a 10.00 pack-year smoking history. She has never used smokeless tobacco. Of note: pt gets severe vascular dilation of face and neck when embarrassed or nervous. The pattern is similar to the current skin flareups. Objective Findings:  Vitals: Blood pressure 130/80, pulse 98, height 5\' 7"  (1.702 m), weight 196 lb (88.9 kg), last menstrual period 11/02/2011.  Physical Examination: General appearance - alert, well appearing, and in no distress Mental status - alert, oriented to person, place, and time Skin - Minimal maculopapular rash over half the thigh 10 cm wide by 20 cm long, appears minimal affect on skin, appears to be a vascular pattern EFG: punctate area at right skin crease, healed fully,   Assessment & Plan:   A:  1. Histamine reaction, of skin, no acute infection noted. 2. ADD 3 no evidence of link of current skin issues to tick bite.  P:  1. Claritin D trial  2. Follow up PRN 3, Advised pt to use list when being evaluated in office   By signing my name below, I, Freida Busmaniana Omoyeni, attest that this documentation has been prepared under the direction and in the presence of Tilda BurrowJohn V Dave Mergen, MD . Electronically Signed: Freida Busmaniana Omoyeni, Scribe. 08/26/2016. 9:11 AM. I personally performed the services described in this documentation, which was SCRIBED in my presence. The recorded information has been reviewed and considered accurate. It has been edited as necessary during review. Tilda BurrowFERGUSON,Camdon Saetern V, MD

## 2016-09-04 ENCOUNTER — Other Ambulatory Visit: Payer: Self-pay | Admitting: Adult Health

## 2016-09-07 ENCOUNTER — Encounter: Payer: Self-pay | Admitting: Adult Health

## 2016-09-07 ENCOUNTER — Other Ambulatory Visit: Payer: Self-pay | Admitting: Adult Health

## 2016-09-08 ENCOUNTER — Telehealth: Payer: Self-pay | Admitting: *Deleted

## 2016-09-08 NOTE — Telephone Encounter (Signed)
Pharmacy called stating they did not receive order for Ambien refill. Stated it was refilled on 09/05/16. Verbal order given.

## 2016-11-29 ENCOUNTER — Other Ambulatory Visit: Payer: Self-pay | Admitting: Adult Health

## 2016-11-30 ENCOUNTER — Other Ambulatory Visit: Payer: Self-pay | Admitting: Adult Health

## 2016-12-27 ENCOUNTER — Other Ambulatory Visit: Payer: Self-pay | Admitting: Adult Health

## 2017-01-24 ENCOUNTER — Ambulatory Visit (INDEPENDENT_AMBULATORY_CARE_PROVIDER_SITE_OTHER): Payer: Medicaid Other | Admitting: Adult Health

## 2017-01-24 ENCOUNTER — Encounter: Payer: Self-pay | Admitting: Adult Health

## 2017-01-24 VITALS — BP 118/80 | HR 72 | Ht 67.0 in | Wt 196.5 lb

## 2017-01-24 DIAGNOSIS — Z87442 Personal history of urinary calculi: Secondary | ICD-10-CM | POA: Diagnosis not present

## 2017-01-24 DIAGNOSIS — Z8719 Personal history of other diseases of the digestive system: Secondary | ICD-10-CM | POA: Insufficient documentation

## 2017-01-24 DIAGNOSIS — R319 Hematuria, unspecified: Secondary | ICD-10-CM

## 2017-01-24 DIAGNOSIS — R1032 Left lower quadrant pain: Secondary | ICD-10-CM

## 2017-01-24 DIAGNOSIS — R5383 Other fatigue: Secondary | ICD-10-CM

## 2017-01-24 DIAGNOSIS — R682 Dry mouth, unspecified: Secondary | ICD-10-CM | POA: Insufficient documentation

## 2017-01-24 DIAGNOSIS — R062 Wheezing: Secondary | ICD-10-CM | POA: Insufficient documentation

## 2017-01-24 LAB — POCT URINALYSIS DIPSTICK
Glucose, UA: NEGATIVE
Ketones, UA: NEGATIVE
Leukocytes, UA: NEGATIVE
Nitrite, UA: NEGATIVE
Protein, UA: NEGATIVE

## 2017-01-24 LAB — HEMOCCULT GUIAC POC 1CARD (OFFICE): FECAL OCCULT BLD: NEGATIVE

## 2017-01-24 NOTE — Progress Notes (Signed)
Subjective:     Patient ID: Stacey Booth, female   DOB: 1976-04-15, 41 y.o.   MRN: [id number]  HPI [name] is a 41 year old white female in with numerous complaints;stomach feels full, rectal bleeding last week, dry mouth with irritation, feels tired and shaky.And she says bladder aches, has history of kidney stones and IBS. She is sp hysterectomy. Has had colonoscopy with Dr Darrick Penna in the past.  Review of Systems Stomach feels full Rectal bleeding last week Dry mouth with irritation Feels tried and shaky Bladder aches  Reviewed past medical,surgical, social and family history. Reviewed medications and allergies.     Objective:   Physical Exam BP 118/80 (BP Location: Left Arm, Patient Position: Sitting, Cuff Size: Normal)   Pulse 72   Ht  (1.702 m)   Wt 196 lb 8 oz (89.1 kg)   LMP 11/02/2011   BMI 30.78 kg/m urine 1+ blood, Skin warm and dry. Neck: mid line trachea, normal thyroid, good ROM, no lymphadenopathy noted. Lungs: exp. Wheezing. Cardiovascular: regular rate and rhythm.Has dry crack right corner of mouth. Abdomen is soft, no HSM, but mildly tender, no CVAT but says whole back tender. Pelvic: external genitalia is normal in appearance no lesions, vagina: pink with good moisture ,urethra has no lesions or masses noted, cervix and uterus are absent, adnexa: no masses, LLQ tenderness noted. Bladder is non tender and no masses felt. Rectal exam shows good tone, no masses or polyps felt or hemorrhoids and hemoccult was negative. She declines chest Xray today, will reassess at F/U.  Will get labs today and [place] in 2 days. Discussed with her this could be GI, or not GYN related.  Assessment:     1. LLQ pain   2. Hematuria, unspecified type   3. Tired   4. History of kidney stones   5. History of IBS   6. History of rectal bleeding   7. Dry mouth   8. Expiratory wheezing       Plan:     Check CBC,CMP,TSH UA C&S sent Return in 2 days for GYN [place] and see me after, if  negative may get CT of abd/pelvis

## 2017-01-25 LAB — CBC
HEMATOCRIT: 41 % (ref 34.0–46.6)
HEMOGLOBIN: 13.4 g/dL (ref 11.1–15.9)
MCH: 30.2 pg (ref 26.6–33.0)
MCHC: 32.7 g/dL (ref 31.5–35.7)
MCV: 93 fL (ref 79–97)
Platelets: 360 10*3/uL (ref 150–379)
RBC: 4.43 x10E6/uL (ref 3.77–5.28)
RDW: 14.6 % (ref 12.3–15.4)
WBC: 13.7 10*3/uL — AB (ref 3.4–10.8)

## 2017-01-25 LAB — COMPREHENSIVE METABOLIC PANEL
ALT: 10 IU/L (ref 0–32)
AST: 10 IU/L (ref 0–40)
Albumin/Globulin Ratio: 1.6 (ref 1.2–2.2)
Albumin: 4.3 g/dL (ref 3.5–5.5)
Alkaline Phosphatase: 79 IU/L (ref 39–117)
BUN/Creatinine Ratio: 14 (ref 9–23)
BUN: 14 mg/dL (ref 6–24)
Bilirubin Total: <0.2 mg/dL (ref 0.0–1.2)
CALCIUM: 9.6 mg/dL (ref 8.7–10.2)
CO2: 24 mmol/L (ref 18–29)
CREATININE: 0.99 mg/dL (ref 0.57–1.00)
Chloride: 94 mmol/L — ABNORMAL LOW (ref 96–106)
GFR calc Af Amer: 81 mL/min/{1.73_m2} (ref >59)
GFR calc non Af Amer: 71 mL/min/{1.73_m2} (ref >59)
GLOBULIN, TOTAL: 2.7 g/dL (ref 1.5–4.5)
Glucose: 110 mg/dL — ABNORMAL HIGH (ref 65–99)
Potassium: 4.8 mmol/L (ref 3.5–5.2)
SODIUM: 137 mmol/L (ref 134–144)
Total Protein: 7 g/dL (ref 6.0–8.5)

## 2017-01-25 LAB — TSH: TSH: 1.32 u[IU]/mL (ref 0.450–4.500)

## 2017-01-26 ENCOUNTER — Encounter: Payer: Self-pay | Admitting: Adult Health

## 2017-01-26 ENCOUNTER — Ambulatory Visit (INDEPENDENT_AMBULATORY_CARE_PROVIDER_SITE_OTHER): Payer: Medicaid Other | Admitting: Adult Health

## 2017-01-26 ENCOUNTER — Ambulatory Visit (INDEPENDENT_AMBULATORY_CARE_PROVIDER_SITE_OTHER): Payer: Medicaid Other

## 2017-01-26 VITALS — BP 110/70 | HR 84 | Ht 67.0 in | Wt 197.0 lb

## 2017-01-26 DIAGNOSIS — R1032 Left lower quadrant pain: Secondary | ICD-10-CM | POA: Diagnosis not present

## 2017-01-26 DIAGNOSIS — I10 Essential (primary) hypertension: Secondary | ICD-10-CM | POA: Diagnosis not present

## 2017-01-26 DIAGNOSIS — R682 Dry mouth, unspecified: Secondary | ICD-10-CM | POA: Diagnosis not present

## 2017-01-26 LAB — URINE CULTURE

## 2017-01-26 MED ORDER — LISINOPRIL 10 MG PO TABS
ORAL_TABLET | ORAL | 6 refills | Status: AC
Start: 2017-01-26 — End: ?

## 2017-01-26 MED ORDER — ZOLPIDEM TARTRATE 10 MG PO TABS: 10.0000 mg | ORAL_TABLET | Freq: Every day | ORAL | 1 refills | Status: DC

## 2017-01-26 NOTE — Progress Notes (Signed)
Subjective:     Patient ID: Stacey Booth, female   DOB: Feb 08, 1976, 41 y.o.   MRN: [id number]  HPI Stacey Booth is a 41 year old white female in for [place] and review labs.She says she needs refills on Ambien and BP meds.  Review of Systems LLQ pain  Dry mouth Reviewed past medical,surgical, social and family history. Reviewed medications and allergies.     Objective:   Physical Exam BP 110/70 (BP Location: Left Arm, Patient Position: Sitting, Cuff Size: Normal)   Pulse 84   Ht  (1.702 m)   Wt 197 lb (89.4 kg)   LMP 11/02/2011   BMI 30.85 kg/m   Reviewed [place] with pt: has 1.8 x 1.5 x 1.4 cm dominant follicle on left ovary. Discussed the could cause pain, but should subside soon ,if not call and will get CT of abd/pelvis.Call GI for F/U and see dentist.    Assessment:     LLQ pain, has dominant follicle left ovary Dry mouth  Hypertension     Plan:     Meds ordered this encounter  Medications  . zolpidem (AMBIEN) 10 MG tablet    Sig: Take 1 tablet (10 mg total) by mouth at bedtime.    Dispense:  30 tablet    Refill:  1    Order Specific Question:   Supervising Provider    Answer:   Despina Hidden, LUTHER H [[id number]]  . lisinopril (PRINIVIL,ZESTRIL) 10 MG tablet    Sig: TAKE 1 TABLET(10 MG) BY MOUTH DAILY    Dispense:  30 tablet    Refill:  6    Order Specific Question:   Supervising Provider    Answer:   Lazaro Arms [[id number]]  F/U with GI See dentist Call if not better by next week, will get CT abd/pelvis  Follow up prn

## 2017-01-26 NOTE — Progress Notes (Signed)
PELVIC US TA/TV: normal vaginal cuff,right adnexa wnl,no adnexal masses,normal left ovary,simple left dominate follicle 1.8 x 1.5 x 1.6 cm,no free fluid,left ovary appears mobile,left adnexal pain during ultrasound

## 2017-02-06 ENCOUNTER — Encounter: Payer: Self-pay | Admitting: Adult Health

## 2017-03-23 ENCOUNTER — Encounter (HOSPITAL_COMMUNITY): Payer: Self-pay | Admitting: *Deleted

## 2017-03-23 ENCOUNTER — Emergency Department (HOSPITAL_COMMUNITY): Payer: Medicaid Other

## 2017-03-23 ENCOUNTER — Emergency Department (HOSPITAL_COMMUNITY)
Admission: EM | Admit: 2017-03-23 | Discharge: 2017-03-23 | Disposition: A | Discharge status: Home / Self Care | Payer: Medicaid Other | Attending: Emergency Medicine | Admitting: Emergency Medicine

## 2017-03-23 DIAGNOSIS — K921 Melena: Secondary | ICD-10-CM

## 2017-03-23 DIAGNOSIS — R1032 Left lower quadrant pain: Secondary | ICD-10-CM | POA: Insufficient documentation

## 2017-03-23 DIAGNOSIS — R112 Nausea with vomiting, unspecified: Secondary | ICD-10-CM | POA: Insufficient documentation

## 2017-03-23 DIAGNOSIS — I10 Essential (primary) hypertension: Secondary | ICD-10-CM | POA: Insufficient documentation

## 2017-03-23 DIAGNOSIS — R195 Other fecal abnormalities: Secondary | ICD-10-CM | POA: Diagnosis not present

## 2017-03-23 DIAGNOSIS — K625 Hemorrhage of anus and rectum: Secondary | ICD-10-CM | POA: Diagnosis present

## 2017-03-23 DIAGNOSIS — Z79899 Other long term (current) drug therapy: Secondary | ICD-10-CM | POA: Diagnosis not present

## 2017-03-23 DIAGNOSIS — F17210 Nicotine dependence, cigarettes, uncomplicated: Secondary | ICD-10-CM | POA: Diagnosis not present

## 2017-03-23 DIAGNOSIS — R1030 Lower abdominal pain, unspecified: Secondary | ICD-10-CM

## 2017-03-23 LAB — CBC
HEMATOCRIT: 40.1 % (ref 36.0–46.0)
HEMOGLOBIN: 13.5 g/dL (ref 12.0–15.0)
MCH: 30.5 pg (ref 26.0–34.0)
MCHC: 33.7 g/dL (ref 30.0–36.0)
MCV: 90.5 fL (ref 78.0–100.0)
Platelets: 362 10*3/uL (ref 150–400)
RBC: 4.43 MIL/uL (ref 3.87–5.11)
RDW: 13.2 % (ref 11.5–15.5)
WBC: 13.7 10*3/uL — ABNORMAL HIGH (ref 4.0–10.5)

## 2017-03-23 LAB — COMPREHENSIVE METABOLIC PANEL
ALBUMIN: 3.8 g/dL (ref 3.5–5.0)
ALK PHOS: 66 U/L (ref 38–126)
ALT: 12 U/L — ABNORMAL LOW (ref 14–54)
ANION GAP: 10 (ref 5–15)
AST: 16 U/L (ref 15–41)
BILIRUBIN TOTAL: 0.4 mg/dL (ref 0.3–1.2)
BUN: 13 mg/dL (ref 6–20)
CALCIUM: 9.6 mg/dL (ref 8.9–10.3)
CO2: 26 mmol/L (ref 22–32)
Chloride: 100 mmol/L — ABNORMAL LOW (ref 101–111)
Creatinine, Ser: 1.04 mg/dL — ABNORMAL HIGH (ref 0.44–1.00)
GFR calc non Af Amer: >60 mL/min (ref >60)
Glucose, Bld: 117 mg/dL — ABNORMAL HIGH (ref 65–99)
Potassium: 3.9 mmol/L (ref 3.5–5.1)
SODIUM: 136 mmol/L (ref 135–145)
TOTAL PROTEIN: 7.3 g/dL (ref 6.5–8.1)

## 2017-03-23 LAB — LIPASE, BLOOD: LIPASE: 18 U/L (ref 11–51)

## 2017-03-23 MED ORDER — PREDNISONE 20 MG PO TABS: 40.0000 mg | ORAL_TABLET | Freq: Every day | ORAL | 0 refills | Status: DC

## 2017-03-23 MED ORDER — IOPAMIDOL (ISOVUE-300) INJECTION 61%
100.0000 mL | Freq: Once | INTRAVENOUS | Status: AC | PRN
Start: 2017-03-23 — End: 2017-03-23
  Administered 2017-03-23: 100 mL via INTRAVENOUS

## 2017-03-23 MED ORDER — PROCHLORPERAZINE EDISYLATE 5 MG/ML IJ SOLN
10.0000 mg | Freq: Once | INTRAMUSCULAR | Status: AC
  Administered 2017-03-23: 10 mg via INTRAVENOUS
  Filled 2017-03-23: qty 2

## 2017-03-23 MED ORDER — SODIUM CHLORIDE 0.9 % IV BOLUS (SEPSIS)
1000.0000 mL | Freq: Once | INTRAVENOUS | Status: AC
  Administered 2017-03-23: 1000 mL via INTRAVENOUS

## 2017-03-23 MED ORDER — PREDNISONE 50 MG PO TABS
60.0000 mg | ORAL_TABLET | ORAL | Status: AC
  Administered 2017-03-23: 16:00:00 60 mg via ORAL
  Filled 2017-03-23: qty 1

## 2017-03-23 MED ORDER — DICYCLOMINE HCL 10 MG/ML IM SOLN
20.0000 mg | Freq: Once | INTRAMUSCULAR | Status: AC
  Administered 2017-03-23: 20 mg via INTRAMUSCULAR
  Filled 2017-03-23: qty 2

## 2017-03-23 NOTE — ED Triage Notes (Signed)
Rectal bleeding

## 2017-03-23 NOTE — ED Notes (Signed)
Two bloody bowel BM this morning. N/V this morning. Pt c/o of feeling "full" in her abdomen.  Pt cant eat or drink.

## 2017-03-23 NOTE — Discharge Instructions (Signed)
As discussed, with your history of IBS, and today's evaluation for abdominal pain and is important that you follow-up with your gastroenterologist. Please take all medication as directed. Return here for concerning changes in your condition.

## 2017-03-23 NOTE — ED Provider Notes (Signed)
[facility] Provider Note   CSN: [id number] Arrival date & time: 03/23/17  1036  By signing my name below, I, Stacey Booth, attest that this documentation has been prepared under the direction and in the presence of Stacey Munch, MD. Electronically Signed: Deland Booth, ED Scribe. 03/23/17. 12:46 PM.  History   Chief Complaint Chief Complaint  Patient presents with  . Rectal Bleeding   The history is provided by the patient. No language interpreter was used.   HPI Comments: Stacey Booth is a 41 y.o. female, with a PMHx of IBS, who presents to the Emergency Department complaining of lower left "cramping" 5/10 abdominal pain with associated hematochezia, abdominal swelling, nausea and one episode of vomiting. The pt denies "straining" on emission. She reports that a month ago she had similar symptoms where she was seen by her OB/GYN who reported that she had a cyst that was causing her problems. Movement exacerbates her symptoms. The pt has a PMSx of total hysterectomy and a PMHx of HTN, and GERD. She denies a new diet, new activity, or recent travel. Pt denies difficulty breathing. No recent gastroenterology involvement   Past Medical History:  Diagnosis Date  . ADD (attention deficit disorder)   . Anxiety   . Arthritis   . Back injury   . Back pain   . Current use of estrogen therapy 03/25/2015  . Depression   . Endometriosis    s/p hysterectomy  . GERD (gastroesophageal reflux disease)   . Headache   . Hematuria 06/02/2014  . Hip pain   . History of kidney stones 06/02/2014  . Hypertension   . IBS (irritable bowel syndrome) 03/19/2014  . Neck injury   . Pinched nerve    and bulging discs  . PONV (postoperative nausea and vomiting)   . PTSD (post-traumatic stress disorder)   . Renal disorder    kidney stones  . Sleep disturbance 09/05/2014  . Vaginal dryness 02/18/2014  . Vitamin D deficiency 01/12/2016    Patient Active Problem List   Diagnosis Date  Noted  . Tired 01/24/2017  . History of IBS 01/24/2017  . Dry mouth 01/24/2017  . History of rectal bleeding 01/24/2017  . Expiratory wheezing 01/24/2017  . Skin rash in pelvic region 08/26/2016  . Vitamin D deficiency 01/12/2016  . Current use of estrogen therapy 03/25/2015  . Sleep disturbance 09/05/2014  . GERD (gastroesophageal reflux disease) 07/30/2014  . Abdominal pain, epigastric 06/19/2014  . Nausea alone 06/19/2014  . LLQ pain 06/19/2014  . Diarrhea 06/19/2014  . Rectal bleeding 06/19/2014  . Hematuria 06/02/2014  . Back pain 06/02/2014  . History of kidney stones 06/02/2014  . IBS (irritable bowel syndrome) 03/19/2014  . Vaginal dryness 02/18/2014  . Hypertension 02/18/2014  . Depressive disorder, not elsewhere classified 04/23/2013  . Symptomatic menopausal or female climacteric states 01/25/2013    Past Surgical History:  Procedure Laterality Date  . ABDOMINAL HYSTERECTOMY    . BACK SURGERY     ruptured disc-laparoscopic repair  . CHOLECYSTECTOMY    . COLONOSCOPY N/A 06/26/2014   Dr. Rourk:normal ileocolonoscopy s/p biopsy. Negative biopsies  . CYSTOSCOPY  5-6 months ago   [place]  . ENDOMETRIAL ABLATION    . ESOPHAGOGASTRODUODENOSCOPY N/A 06/26/2014   Dr. Ace Gins gastric mucosa-status post gastric biopsy, slight chronic gastritis without H.pylori  . MOUTH SURGERY    . SALPINGOOPHORECTOMY  11/30/2011   Procedure: SALPINGO OOPHERECTOMY;  Surgeon: Lazaro Arms, MD;  Location: [facility];  Service: Gynecology;  Laterality: Right;  . TONSILLECTOMY    . TUBAL LIGATION    . VAGINAL HYSTERECTOMY  11/30/2011   Procedure: HYSTERECTOMY VAGINAL;  Surgeon: Lazaro Arms, MD;  Location: [facility];  Service: Gynecology;  Laterality: N/A;  transvaginal hysterectomy with right salpingo-oophorectomy    OB History    Gravida Para Term Preterm AB Living   4 2 2   2 2    SAB TAB Ectopic Multiple Live Births   1   1   2        Home Medications    Prior to Admission  medications   Medication Sig Start Date End Date Taking? Authorizing Provider  acetaminophen (TYLENOL) 325 MG tablet Take 650 mg by mouth every 6 (six) hours as needed.   Yes [provider]  Cholecalciferol 5000 units capsule Take 1 capsule (5,000 Units total) by mouth daily. 01/12/16  Yes Cyril Mourning A, NP  diphenhydrAMINE (BENADRYL) 25 mg capsule Take 25 mg by mouth as needed.   Yes [provider]  estradiol (ESTRACE) 1 MG tablet TAKE 1 TABLET BY MOUTH TWICE DAILY 07/13/16  Yes Lazaro Arms, MD  hydrochlorothiazide (HYDRODIURIL) 25 MG tablet TAKE 1 TABLET(25 MG) BY MOUTH DAILY 06/13/16  Yes Cyril Mourning A, NP  ibuprofen (ADVIL,MOTRIN) 200 MG tablet Take 800 mg by mouth 2 (two) times daily as needed for moderate pain. For pain   Yes [provider]  lisinopril (PRINIVIL,ZESTRIL) 10 MG tablet TAKE 1 TABLET(10 MG) BY MOUTH DAILY 01/26/17  Yes Cyril Mourning A, NP  pantoprazole (PROTONIX) 40 MG tablet TAKE 1 TABLET(40 MG) BY MOUTH DAILY 01/12/16  Yes Cyril Mourning A, NP  promethazine (PHENERGAN) 25 MG tablet TAKE 1 TABLET BY MOUTH EVERY 6 HOURS AS NEEDED FOR NAUSEA OR VOMITING 11/29/16  Yes Cyril Mourning A, NP  zolpidem (AMBIEN) 10 MG tablet Take 1 tablet (10 mg total) by mouth at bedtime. 01/26/17  Yes Adline Potter, NP    Family History Family History  Problem Relation Age of Onset  . Cancer Father   . Diabetes Father   . Heart attack Father   . Stroke Father   . Diabetes Maternal Grandmother   . Thyroid disease Mother   . Goiter Mother   . Schizophrenia Sister   . Bipolar disorder Sister   . ADD / ADHD Son   . Asperger's syndrome Son   . Anxiety disorder Son   . Autism spectrum disorder Son   . Sleep disorder Son   . Other Paternal Grandmother        eye disease  . Asthma Brother   . Anesthesia problems Neg Hx   . Hypotension Neg Hx   . Malignant hyperthermia Neg Hx   . Pseudochol deficiency Neg Hx     Social History Social  History  Substance Use Topics  . Smoking status: Current Every Day Smoker    Packs/day: 0.50    Years: 20.00    Types: Cigarettes  . Smokeless tobacco: Never Used     Comment: 1/2 pack daily  . Alcohol use No     Allergies   Other; Gabapentin; Naproxen; and Penicillins   Review of Systems Review of Systems  Respiratory: Negative for shortness of breath.   Gastrointestinal: Positive for abdominal pain, blood in stool, nausea and vomiting.  Genitourinary: Negative.   Skin: Negative for color change.  Allergic/Immunologic: Negative for immunocompromised state.  Neurological: Negative for weakness.  Psychiatric/Behavioral: The patient is nervous/anxious.   All other systems  reviewed and are negative.    Physical Exam Updated Vital Signs BP 106/73   Pulse 94   Temp 98.2 F (36.8 C) (Oral)   Resp (!) 21   Ht 5\' 7"  (1.702 m)   Wt 86.2 kg (190 lb)   LMP 11/02/2011   SpO2 96%   BMI 29.76 kg/m   Physical Exam  Constitutional: She is oriented to person, place, and time. She appears well-developed and well-nourished. No distress.  HENT:  Head: Normocephalic and atraumatic.  Eyes: Conjunctivae and EOM are normal.  Cardiovascular: Normal rate and regular rhythm.   Pulmonary/Chest: Effort normal and breath sounds normal. No stridor. No respiratory distress.  Abdominal: Soft. She exhibits no distension. There is no tenderness.  Musculoskeletal: She exhibits no edema.  Neurological: She is alert and oriented to person, place, and time. No cranial nerve deficit.  Skin: Skin is warm and dry.  Psychiatric: She has a normal mood and affect.  Nursing note and vitals reviewed.    ED Treatments / Results    COORDINATION OF CARE: 12:12 PM-Discussed next steps with pt. Pt verbalized understanding and is agreeable with the plan.   Labs (all labs ordered are listed, but only abnormal results are displayed) Labs Reviewed  COMPREHENSIVE METABOLIC PANEL - Abnormal; Notable for  the following:       Result Value   Chloride 100 (*)    Glucose, Bld 117 (*)    Creatinine, Ser 1.04 (*)    ALT 12 (*)    All other components within normal limits  CBC - Abnormal; Notable for the following:    WBC 13.7 (*)    All other components within normal limits  LIPASE, BLOOD    Radiology Ct Abdomen Pelvis W Contrast  Result Date: 03/23/2017 CLINICAL DATA:  Abdominal pain. Hematochezia. Abdominal distention. Nausea and 1 episode of vomiting. Previous hysterectomy, right oophorectomy and cholecystectomy. EXAM: CT ABDOMEN AND PELVIS WITH CONTRAST TECHNIQUE: Multidetector CT imaging of the abdomen and pelvis was performed using the standard protocol following bolus administration of intravenous contrast. CONTRAST:  ISOVUE-300 IOPAMIDOL (ISOVUE-300) INJECTION 61% COMPARISON:  Pelvic ultrasound at [facility] dated 01/26/2017 and abdomen and pelvis CT dated 10/15/2011. FINDINGS: Lower chest: Minimal linear atelectasis or scarring at the left lung base. Hepatobiliary: Cholecystectomy clips. Mildly lobulated liver contours with a prominent lateral segment left lobe and prominent caudate lobe. The right lobe is somewhat small. Pancreas: Unremarkable. No pancreatic ductal dilatation or surrounding inflammatory changes. Spleen: Normal in size without focal abnormality. 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 Both proximal ureters are mildly dilated without distal dilatation. No obstructing calculi or mass seen. Normal appearing left kidney and ureter and urinary bladder. Normal appearing adrenal glands. Stomach/Bowel: Mild sigmoid diverticulosis. Unremarkable stomach and small bowel. No evidence of appendicitis. Vascular/Lymphatic: No significant vascular findings are present. No enlarged abdominal or pelvic lymph nodes. Reproductive: Surgically absent uterus and right ovary. 2.2 cm left ovarian follicle. Other: Small umbilical  hernia containing fat. Musculoskeletal: Lumbar and lower thoracic spine degenerative changes. IMPRESSION: 1. No acute abnormality. 2. Mild sigmoid diverticulosis. 3. Possible early changes of cirrhosis of the liver. Electronically Signed   By: Beckie Salts M.D.   On: 03/23/2017 14:22    Procedures Procedures (including critical care time)  Medications Ordered in ED Medications  dicyclomine (BENTYL) injection 20 mg (20 mg Intramuscular Given 03/23/17 1346)  sodium chloride 0.9 % bolus 1,000 mL (1,000 mLs Intravenous New Bag/Given 03/23/17  1346)  prochlorperazine (COMPAZINE) injection 10 mg (10 mg Intravenous Given 03/23/17 1346)  iopamidol (ISOVUE-300) 61 % injection 100 mL (100 mLs Intravenous Contrast Given 03/23/17 1402)     Initial Impression / Assessment and Plan / ED Course  I have reviewed the triage vital signs and the nursing notes.  Pertinent labs & imaging results that were available during my care of the patient were reviewed by me and considered in my medical decision making (see chart for details).  3:00 PM Patient awake and alert, speaking clearly.  No additional bowel movements nor bleeding.  We discussed all findings. There is suspicion for IBS flare given the patient's abdominal pain, history, bloody stool. No hemodynamically instability, this is no drop in her hemoglobin value. Patient will receive initial steroid bolus, continue this for next few days, understands the importance of following up with gastroenterology.   Final Clinical Impressions(s) / ED Diagnoses  Abdominal pain Bloody stool   I personally performed the services described in this documentation, which was scribed in my presence. The recorded information has been reviewed and is accurate.       Stacey MunchLockwood, Kyngston Pickelsimer, MD 03/23/17 [phone]

## 2017-04-24 ENCOUNTER — Encounter: Payer: Self-pay | Admitting: Adult Health

## 2017-04-24 ENCOUNTER — Ambulatory Visit (INDEPENDENT_AMBULATORY_CARE_PROVIDER_SITE_OTHER): Payer: Medicaid Other | Admitting: Adult Health

## 2017-04-24 VITALS — BP 110/78 | HR 66 | Ht 67.0 in | Wt 195.5 lb

## 2017-04-24 DIAGNOSIS — R350 Frequency of micturition: Secondary | ICD-10-CM

## 2017-04-24 DIAGNOSIS — B009 Herpesviral infection, unspecified: Secondary | ICD-10-CM

## 2017-04-24 DIAGNOSIS — N898 Other specified noninflammatory disorders of vagina: Secondary | ICD-10-CM | POA: Diagnosis not present

## 2017-04-24 DIAGNOSIS — R319 Hematuria, unspecified: Secondary | ICD-10-CM | POA: Diagnosis not present

## 2017-04-24 LAB — POCT URINALYSIS DIPSTICK
GLUCOSE UA: NEGATIVE
KETONES UA: NEGATIVE
Nitrite, UA: NEGATIVE
Protein, UA: NEGATIVE

## 2017-04-24 MED ORDER — VALACYCLOVIR HCL 1 G PO TABS: 1000.0000 mg | ORAL_TABLET | Freq: Two times a day (BID) | ORAL | 1 refills | Status: AC

## 2017-04-24 MED ORDER — PHENAZOPYRIDINE HCL 200 MG PO TABS: 200.0000 mg | ORAL_TABLET | Freq: Three times a day (TID) | ORAL | 0 refills | Status: AC | PRN

## 2017-04-24 MED ORDER — SULFAMETHOXAZOLE-TRIMETHOPRIM 800-160 MG PO TABS: 1.0000 | ORAL_TABLET | Freq: Two times a day (BID) | ORAL | 0 refills | Status: AC

## 2017-04-24 NOTE — Progress Notes (Signed)
Subjective:     Patient ID: Stacey CoveyRobin S Booth, female   DOB: 16-Jun-1976, 41 y.o.   MRN: [id number]  HPI Stacey BallRobin is a 41 year old white female, worked in for low back pain, and has urinary frequency, and has pain at "pee hole area".  Review of Systems  Vaginal irritation  Low back pain Urinary frequency Reviewed past medical,surgical, social and family history. Reviewed medications and allergies.     Objective:   Physical Exam BP 110/78 (BP Location: Left Arm, Patient Position: Sitting, Cuff Size: Normal)   Pulse 66   Ht 5\' 7"  (1.702 m)   Wt 195 lb 8 oz (88.7 kg)   LMP 11/02/2011   BMI 30.62 kg/m urine dipstick 2+ leuks, 1+ blood, Skin warm and dry.Pelvic: external genitalia is normal in appearance, except clitoris is red and looks raw, ?ruptured vesicle, vagina: pink with good moisture and rugae,urethra has no lesions or masses noted, cervix and uterus are absent,adnexa: no masses or tenderness noted. Bladder is mildly tender and no masses felt. No CVAT pain.   She has a history of herpes, but no outbreaks in years, I told her the area at clitoris looks like herpes and will treat as such. And will treat for possible UTI.  Assessment:     1. Vaginal irritation   2. Urinary frequency   3. Hematuria, unspecified type   4. HSV infection       Plan:     UA C&S sent Meds ordered this encounter  Medications  . sulfamethoxazole-trimethoprim (BACTRIM DS,SEPTRA DS) 800-160 MG tablet    Sig: Take 1 tablet by mouth 2 (two) times daily.    Dispense:  14 tablet    Refill:  0    Order Specific Question:   Supervising Provider    Answer:   Stacey HiddenEURE, Stacey H [[id number]]  . phenazopyridine (PYRIDIUM) 200 MG tablet    Sig: Take 1 tablet (200 mg total) by mouth 3 (three) times daily as needed for pain.    Dispense:  10 tablet    Refill:  0    Order Specific Question:   Supervising Provider    Answer:   Stacey HiddenEURE, Stacey H [[id number]]  . valACYclovir (VALTREX) 1000 MG tablet    Sig: Take 1 tablet (1,000 mg  total) by mouth 2 (two) times daily.    Dispense:  20 tablet    Refill:  1    Order Specific Question:   Supervising Provider    Answer:   Stacey ArmsEURE, Stacey H [[id number]]  Push water Follow up prn

## 2017-04-25 LAB — URINALYSIS, ROUTINE W REFLEX MICROSCOPIC
Bilirubin, UA: NEGATIVE
Glucose, UA: NEGATIVE
KETONES UA: NEGATIVE
NITRITE UA: NEGATIVE
Protein, UA: NEGATIVE
Specific Gravity, UA: 1.009 (ref 1.005–1.030)
Urobilinogen, Ur: 0.2 mg/dL (ref 0.2–1.0)
pH, UA: 6.5 (ref 5.0–7.5)

## 2017-04-25 LAB — MICROSCOPIC EXAMINATION: Casts: NONE SEEN /lpf

## 2017-04-26 LAB — URINE CULTURE

## 2017-05-03 ENCOUNTER — Other Ambulatory Visit: Payer: Self-pay | Admitting: Adult Health

## 2017-05-08 ENCOUNTER — Other Ambulatory Visit: Payer: Self-pay | Admitting: *Deleted

## 2017-06-01 ENCOUNTER — Other Ambulatory Visit: Payer: Self-pay | Admitting: Adult Health

## 2017-06-14 ENCOUNTER — Other Ambulatory Visit: Payer: Self-pay | Admitting: Adult Health

## 2017-07-14 IMAGING — CT CT ABD-PELV W/ CM
2 of 5 series · 16 of 46 positions shown, 18 images · IV contrast (Isovue)
Comparison: Pelvic ultrasound at [HOS[facility]ITAL] OBG[facility] dated 01/26/2017
and abdomen and pelvis CT dated 10/15/2011.

CLINICAL DATA: Abdominal pain. Hematochezia. Abdominal distention.
Nausea and 1 episode of vomiting. Previous hysterectomy, right
oophorectomy and cholecystectomy.

EXAM:
CT ABDOMEN AND PELVIS WITH CONTRAST
TECHNIQUE: Multidetector CT imaging of the abdomen and pelvis was performed
using the standard protocol following bolus administration of
intravenous contrast.
CONTRAST:  100mL NCHVAW-EEE IOPAMIDOL (NCHVAW-EEE) INJECTION 61%

[Series 2: axial st · axial · 0.79mm/px · z∈[-125,+315]mm · 13 of 100 slices shown, 15 images]
[im 6/100  soft-tissue]
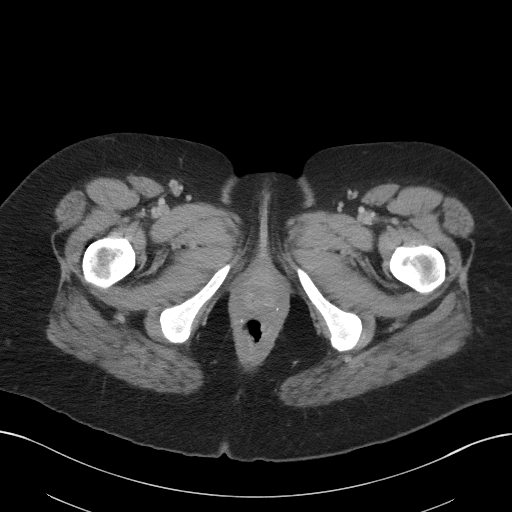
[im 6/100  bone]
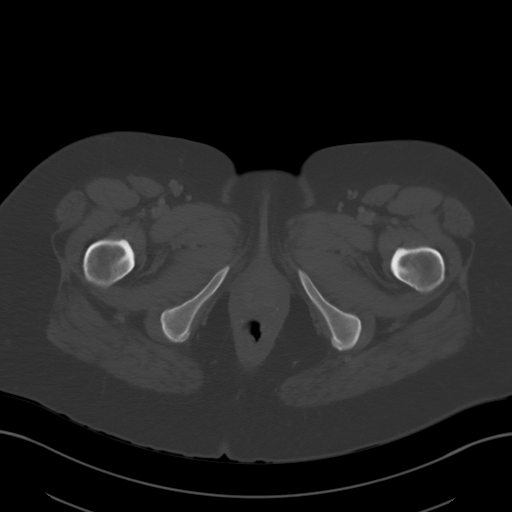
[im 12/100  soft-tissue]
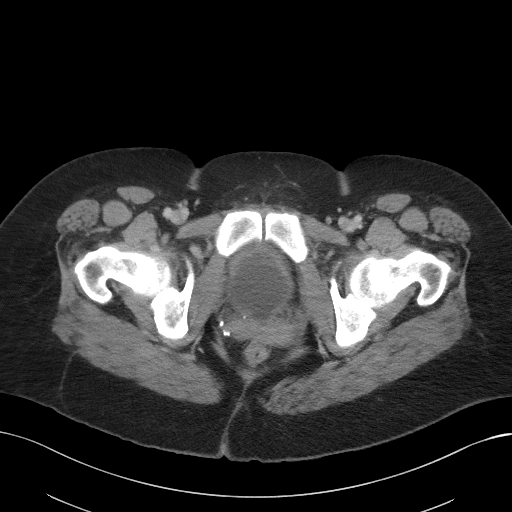
[im 23/100  soft-tissue]
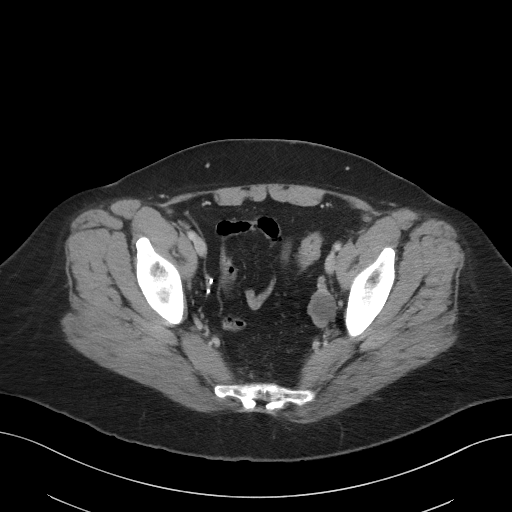
[im 28/100  soft-tissue]
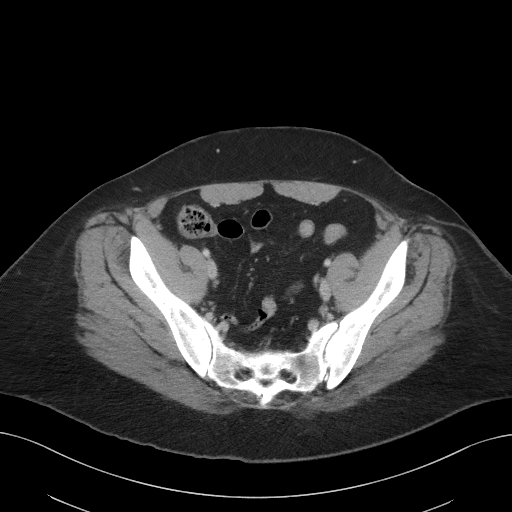
[im 34/100  soft-tissue]
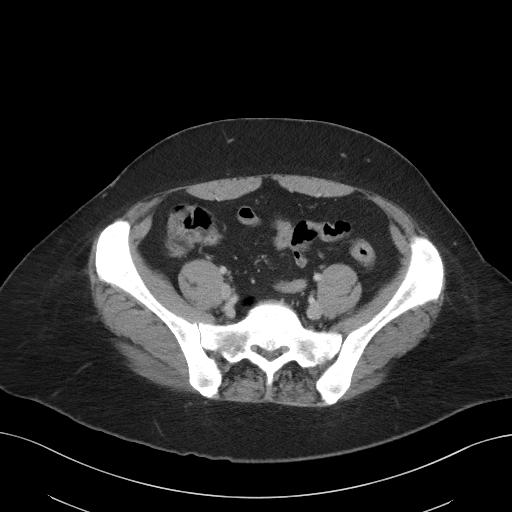
[im 45/100  soft-tissue]
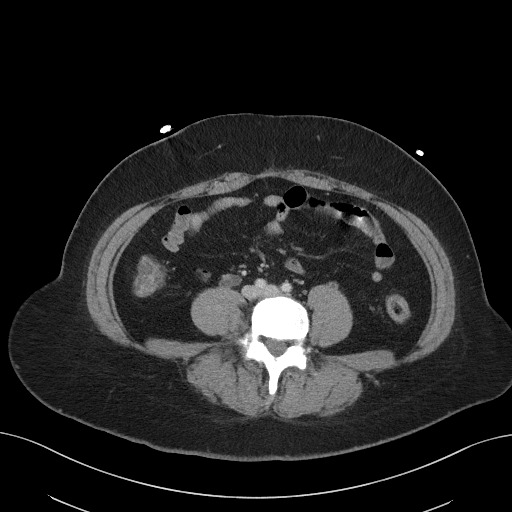
[im 50/100  soft-tissue]
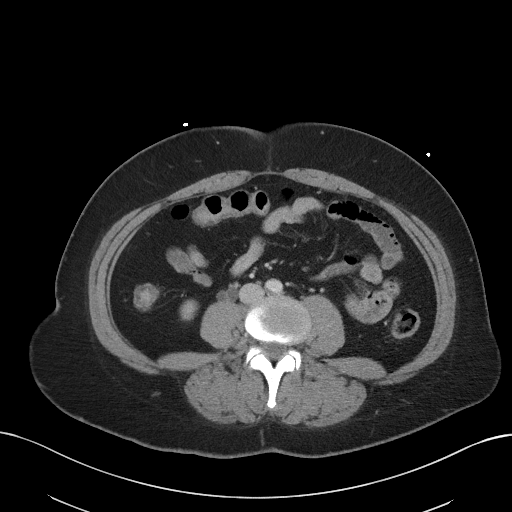
[im 56/100  soft-tissue]
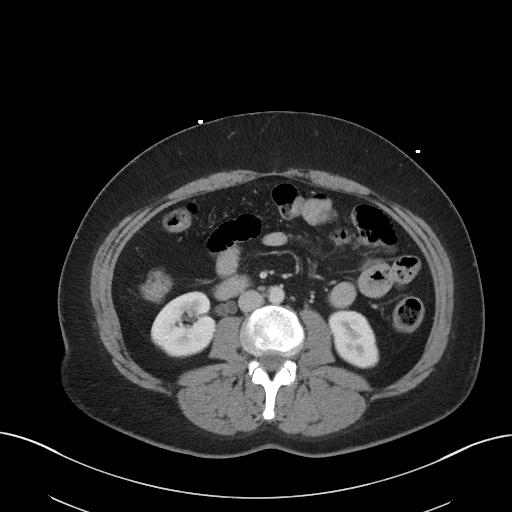
[im 67/100  soft-tissue]
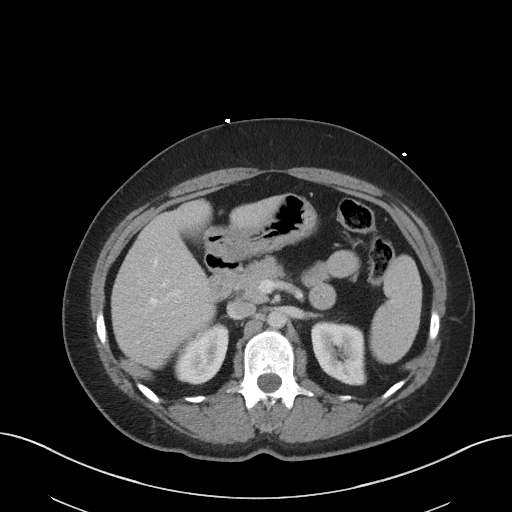
[im 67/100  bone]
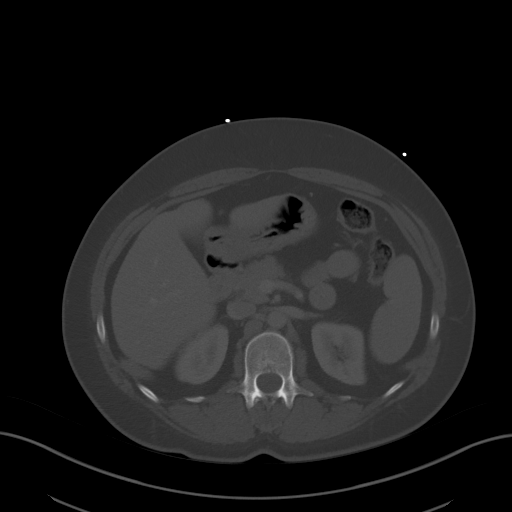
[im 72/100  soft-tissue]
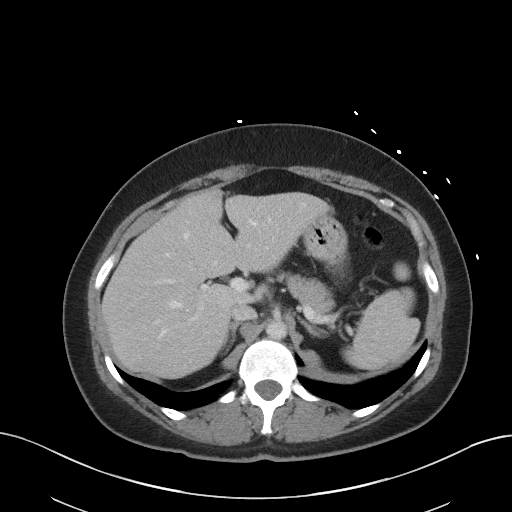
[im 78/100  soft-tissue]
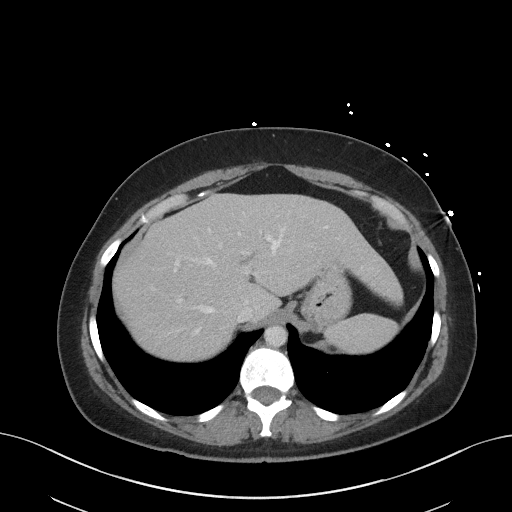
[im 89/100  soft-tissue]
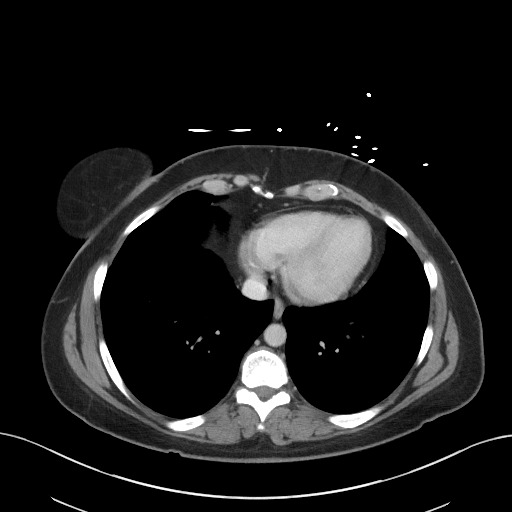
[im 94/100  soft-tissue]
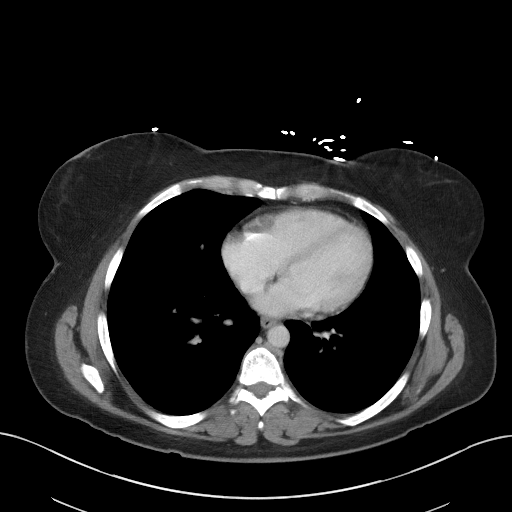

[Series 5: coronal st · coronal · 0.78mm/px · 3 of 94 slices shown]
[im 32/94  soft-tissue]
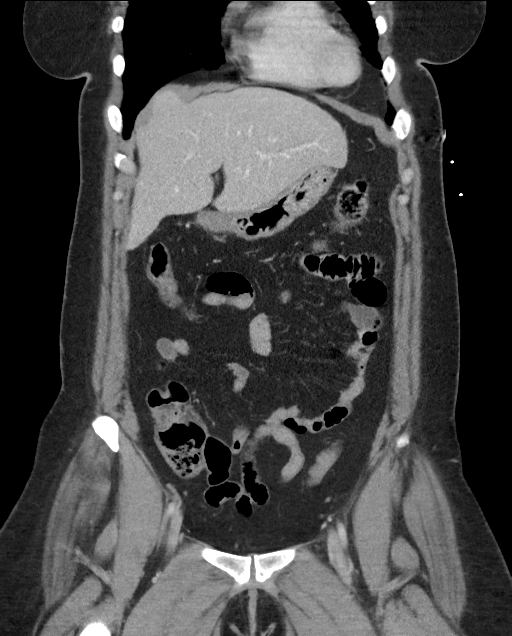
[im 42/94  soft-tissue]
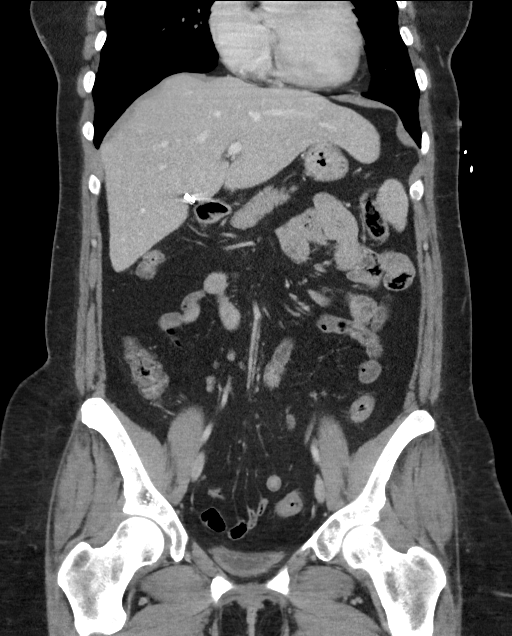
[im 52/94  soft-tissue]
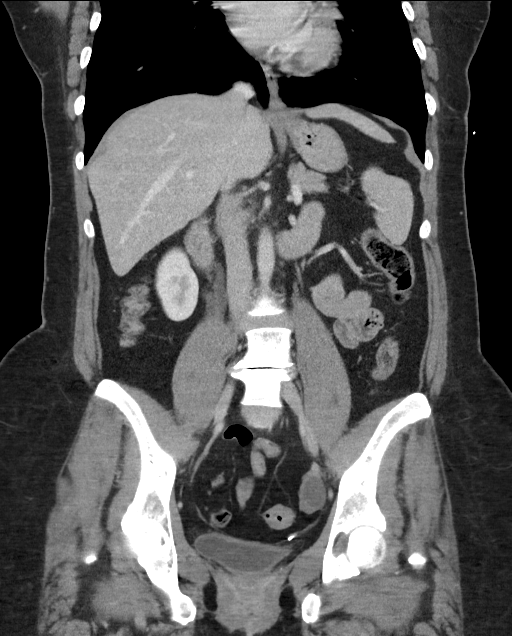

[16 of 46 positions shown; findings below may reference images not displayed]

FINDINGS: Lower chest: Minimal linear atelectasis or scarring at the left lung
base.

Hepatobiliary: Cholecystectomy clips. Mildly lobulated liver
contours with a prominent lateral segment left lobe and prominent
caudate lobe. The right lobe is somewhat small.

Pancreas: Unremarkable. No pancreatic ductal dilatation or
surrounding inflammatory changes.

Spleen: Normal in size without focal abnormality.

Adrenals/Urinary Tract: Degraded right renal collecting system with
duplication of the proximal ureters. Both proximal ureters are
mildly dilated without distal dilatation. No obstructing calculi or
mass seen. Normal appearing left kidney and ureter and urinary
bladder. Normal appearing adrenal glands.

Stomach/Bowel: Mild sigmoid diverticulosis. Unremarkable stomach and
small bowel. No evidence of appendicitis.

Vascular/Lymphatic: No significant vascular findings are present. No
enlarged abdominal or pelvic lymph nodes.

Reproductive: Surgically absent uterus and right ovary. 2.2 cm left
ovarian follicle.

Other: Small umbilical hernia containing fat.

Musculoskeletal: Lumbar and lower thoracic spine degenerative
changes.
IMPRESSION: 1. No acute abnormality.
2. Mild sigmoid diverticulosis.
3. Possible early changes of cirrhosis of the liver.

## 2017-07-22 ENCOUNTER — Other Ambulatory Visit: Payer: Self-pay | Admitting: Obstetrics & Gynecology

## 2018-05-08 ENCOUNTER — Other Ambulatory Visit: Payer: Self-pay

## 2018-05-08 ENCOUNTER — Emergency Department (HOSPITAL_COMMUNITY)
Admission: EM | Admit: 2018-05-08 | Discharge: 2018-05-08 | Disposition: A | Discharge status: Home / Self Care | Payer: Self-pay | Attending: Emergency Medicine | Admitting: Emergency Medicine

## 2018-05-08 ENCOUNTER — Encounter (HOSPITAL_COMMUNITY): Payer: Self-pay | Admitting: *Deleted

## 2018-05-08 DIAGNOSIS — T7840XA Allergy, unspecified, initial encounter: Secondary | ICD-10-CM | POA: Insufficient documentation

## 2018-05-08 DIAGNOSIS — I10 Essential (primary) hypertension: Secondary | ICD-10-CM | POA: Insufficient documentation

## 2018-05-08 DIAGNOSIS — Z79899 Other long term (current) drug therapy: Secondary | ICD-10-CM | POA: Insufficient documentation

## 2018-05-08 DIAGNOSIS — F17210 Nicotine dependence, cigarettes, uncomplicated: Secondary | ICD-10-CM | POA: Insufficient documentation

## 2018-05-08 DIAGNOSIS — L509 Urticaria, unspecified: Secondary | ICD-10-CM | POA: Insufficient documentation

## 2018-05-08 HISTORY — DX: Unspecified convulsions: R56.9

## 2018-05-08 MED ORDER — PREDNISONE 20 MG PO TABS: ORAL_TABLET | ORAL | 0 refills | Status: AC

## 2018-05-08 MED ORDER — METHYLPREDNISOLONE SODIUM SUCC 125 MG IJ SOLR
INTRAMUSCULAR | Status: AC
  Administered 2018-05-08: 125 mg via INTRAVENOUS
  Filled 2018-05-08: qty 2

## 2018-05-08 MED ORDER — LEVALBUTEROL HCL 1.25 MG/0.5ML IN NEBU
1.2500 mg | INHALATION_SOLUTION | Freq: Once | RESPIRATORY_TRACT | Status: AC
  Administered 2018-05-08: 1.25 mg via RESPIRATORY_TRACT
  Filled 2018-05-08: qty 0.5

## 2018-05-08 MED ORDER — DIPHENHYDRAMINE HCL 50 MG/ML IJ SOLN
INTRAMUSCULAR | Status: AC
  Administered 2018-05-08: 50 mg via INTRAVENOUS
  Filled 2018-05-08: qty 1

## 2018-05-08 MED ORDER — EPINEPHRINE PF 1 MG/ML IJ SOLN
0.3000 mg | Freq: Once | INTRAMUSCULAR | Status: AC
  Administered 2018-05-08: 0.3 mg via INTRAMUSCULAR
  Filled 2018-05-08: qty 1

## 2018-05-08 MED ORDER — FAMOTIDINE IN NACL 20-0.9 MG/50ML-% IV SOLN
INTRAVENOUS | Status: AC
  Administered 2018-05-08: 20 mg via INTRAVENOUS
  Filled 2018-05-08: qty 50

## 2018-05-08 MED ORDER — EPINEPHRINE PF 1 MG/ML IJ SOLN: 0.2000 mg | Freq: Once | INTRAMUSCULAR | Status: DC

## 2018-05-08 MED ORDER — EPINEPHRINE 0.3 MG/0.3ML IJ SOAJ: 0.3000 mg | Freq: Once | INTRAMUSCULAR | 0 refills | Status: AC

## 2018-05-08 MED ORDER — FAMOTIDINE 20 MG PO TABS: 20.0000 mg | ORAL_TABLET | Freq: Two times a day (BID) | ORAL | 0 refills | Status: AC

## 2018-05-08 MED ORDER — METHYLPREDNISOLONE SODIUM SUCC 125 MG IJ SOLR
125.0000 mg | Freq: Once | INTRAMUSCULAR | Status: AC
  Administered 2018-05-08: 125 mg via INTRAVENOUS

## 2018-05-08 MED ORDER — SODIUM CHLORIDE 0.9 % IN NEBU
INHALATION_SOLUTION | RESPIRATORY_TRACT | Status: AC
  Administered 2018-05-08: 3 mL
  Filled 2018-05-08: qty 3

## 2018-05-08 MED ORDER — DIPHENHYDRAMINE HCL 50 MG/ML IJ SOLN
50.0000 mg | Freq: Once | INTRAMUSCULAR | Status: AC
  Administered 2018-05-08: 50 mg via INTRAVENOUS

## 2018-05-08 MED ORDER — FAMOTIDINE IN NACL 20-0.9 MG/50ML-% IV SOLN
20.0000 mg | Freq: Once | INTRAVENOUS | Status: AC
  Administered 2018-05-08: 20 mg via INTRAVENOUS

## 2018-05-08 NOTE — ED Triage Notes (Signed)
Pt states she woke up x 30 minutes ago with rash all over body and difficulty breathing and swallowing;

## 2018-05-08 NOTE — ED Provider Notes (Signed)
[facility] EMERGENCY DEPARTMENT Provider Note   CSN: [id number] Arrival date & time: 05/08/18  0021  Time seen 12:29 AM   History   Chief Complaint Chief Complaint  Patient presents with  . Allergic Reaction    HPI Stacey Booth is a 42 y.o. female.  HPI   patient states she laid down about 10 PM and midnight she woke up with hives all over.  She feels like her lips are swelling and she is having trouble breathing and swallowing.  She states she took 3 Benadryl at home prior to coming, however we did not know this to after she got Benadryl here.  She states she is on no medication except she has been taking Advil Cold and Sinus over-the-counter for the past week and some Tylenol.  She states she has had similar reactions in the past to insect stings.  She states she was outside today for about an hour but is not aware of any specific insect sting.  She states she has been having a cough for a couple weeks and she has had wheezing in the past.  She states she had some mild wheezing prior to the allergic reaction tonight.  PCP Patient, No Pcp Per   Past Medical History:  Diagnosis Date  . ADD (attention deficit disorder)   . Anxiety   . Arthritis   . Back injury   . Back pain   . Current use of estrogen therapy 03/25/2015  . Depression   . Endometriosis    s/p hysterectomy  . GERD (gastroesophageal reflux disease)   . Headache   . Hematuria 06/02/2014  . Hip pain   . History of kidney stones 06/02/2014  . Hypertension   . IBS (irritable bowel syndrome) 03/19/2014  . Neck injury   . Pinched nerve    and bulging discs  . PONV (postoperative nausea and vomiting)   . PTSD (post-traumatic stress disorder)   . Renal disorder    kidney stones  . Seizures (HCC)   . Sleep disturbance 09/05/2014  . Vaginal dryness 02/18/2014  . Vitamin D deficiency 01/12/2016    Patient Active Problem List   Diagnosis Date Noted  . 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 Urinary frequency 04/24/2017  .  Tired 01/24/2017  . History of IBS 01/24/2017  . Dry mouth 01/24/2017  . History of rectal bleeding 01/24/2017  . Expiratory wheezing 01/24/2017  . Skin rash in pelvic region 08/26/2016  . Vitamin D deficiency 01/12/2016  . Current use of estrogen therapy 03/25/2015  . Sleep disturbance 09/05/2014  . GERD (gastroesophageal reflux disease) 07/30/2014  . Abdominal pain, epigastric 06/19/2014  . Nausea alone 06/19/2014  . LLQ pain 06/19/2014  . Diarrhea 06/19/2014  . Rectal bleeding 06/19/2014  . Hematuria 06/02/2014  . Back pain 06/02/2014  . History of kidney stones 06/02/2014  . IBS (irritable bowel syndrome) 03/19/2014  . Vaginal dryness 02/18/2014  . Hypertension 02/18/2014  . Depressive disorder, not elsewhere classified 04/23/2013  . Symptomatic menopausal or female climacteric states 01/25/2013    Past Surgical History:  Procedure Laterality Date  . ABDOMINAL HYSTERECTOMY    . BACK SURGERY     ruptured disc-laparoscopic repair  . CHOLECYSTECTOMY    . COLONOSCOPY N/A 06/26/2014   Dr. Rourk:normal ileocolonoscopy s/p biopsy. Negative biopsies  . CYSTOSCOPY  5-6 months ago   [place]  . ENDOMETRIAL ABLATION    . ESOPHAGOGASTRODUODENOSCOPY N/A 06/26/2014   Dr. Ace Gins gastric mucosa-status post gastric biopsy, slight chronic gastritis  without H.pylori  . MOUTH SURGERY    . SALPINGOOPHORECTOMY  11/30/2011   Procedure: SALPINGO OOPHERECTOMY;  Surgeon: Lazaro Arms, MD;  Location: [facility];  Service: Gynecology;  Laterality: Right;  . TONSILLECTOMY    . TUBAL LIGATION    . VAGINAL HYSTERECTOMY  11/30/2011   Procedure: HYSTERECTOMY VAGINAL;  Surgeon: Lazaro Arms, MD;  Location: [facility];  Service: Gynecology;  Laterality: N/A;  transvaginal hysterectomy with right salpingo-oophorectomy     OB History    Gravida  4   Para  2   Term  2   Preterm      AB  2   Living  2     SAB  1   TAB      Ectopic  1   Multiple      Live Births  2            Home  Medications   None but advil cold and sinus, acetaminophen   Prior to Admission medications   Medication Sig Start Date End Date Taking? Authorizing Provider  acetaminophen (TYLENOL) 325 MG tablet Take 650 mg by mouth every 6 (six) hours as needed.    [provider]  Cholecalciferol 5000 units capsule Take 1 capsule (5,000 Units total) by mouth daily. 01/12/16   Adline Potter, NP  diphenhydrAMINE (BENADRYL) 25 mg capsule Take 25 mg by mouth as needed.    [provider]  EPINEPHrine 0.3 mg/0.3 mL IJ SOAJ injection Inject 0.3 mLs (0.3 mg total) into the muscle once for 1 dose. 05/08/18 05/08/18  Devoria Albe, MD  estradiol (ESTRACE) 1 MG tablet TAKE 1 TABLET BY MOUTH TWICE DAILY 07/24/17   Lazaro Arms, MD  famotidine (PEPCID) 20 MG tablet Take 1 tablet (20 mg total) by mouth 2 (two) times daily. 05/08/18   Devoria Albe, MD  hydrochlorothiazide (HYDRODIURIL) 25 MG tablet TAKE 1 TABLET(25 MG) BY MOUTH DAILY 06/14/17   Cyril Mourning A, NP  ibuprofen (ADVIL,MOTRIN) 200 MG tablet Take 800 mg by mouth 2 (two) times daily as needed for moderate pain. For pain    [provider]  lisinopril (PRINIVIL,ZESTRIL) 10 MG tablet TAKE 1 TABLET(10 MG) BY MOUTH DAILY 01/26/17   Cyril Mourning A, NP  pantoprazole (PROTONIX) 40 MG tablet TAKE 1 TABLET(40 MG) BY MOUTH DAILY 01/12/16   Adline Potter, NP  phenazopyridine (PYRIDIUM) 200 MG tablet Take 1 tablet (200 mg total) by mouth 3 (three) times daily as needed for pain. 04/24/17   Adline Potter, NP  predniSONE (DELTASONE) 20 MG tablet Take 3 po QD x 3d , then 2 po QD x 3d then 1 po QD x 3d 05/08/18   Devoria Albe, MD  promethazine (PHENERGAN) 25 MG tablet TAKE 1 TABLET BY MOUTH EVERY 6 HOURS AS NEEDED FOR NAUSEA OR VOMITING 06/02/17   Cyril Mourning A, NP  sulfamethoxazole-trimethoprim (BACTRIM DS,SEPTRA DS) 800-160 MG tablet Take 1 tablet by mouth 2 (two) times daily. 04/24/17   Adline Potter, NP  valACYclovir (VALTREX)  1000 MG tablet Take 1 tablet (1,000 mg total) by mouth 2 (two) times daily. 04/24/17   Adline Potter, NP  zolpidem (AMBIEN) 10 MG tablet TAKE 1 TABLET BY MOUTH EVERY DAY AT BEDTIME 05/08/17   Adline Potter, NP    Family History Family History  Problem Relation Age of Onset  . Cancer Father   . Diabetes Father   . Heart attack Father   . Stroke Father   .  Diabetes Maternal Grandmother   . Thyroid disease Mother   . Goiter Mother   . Schizophrenia Sister   . Bipolar disorder Sister   . ADD / ADHD Son   . Asperger's syndrome Son   . Anxiety disorder Son   . Autism spectrum disorder Son   . Sleep disorder Son   . Other Paternal Grandmother        eye disease  . Asthma Brother   . Anesthesia problems Neg Hx   . Hypotension Neg Hx   . Malignant hyperthermia Neg Hx   . Pseudochol deficiency Neg Hx     Social History Social History   Tobacco Use  . Smoking status: Current Every Day Smoker    Packs/day: 0.50    Years: 20.00    Pack years: 10.00    Types: Cigarettes  . Smokeless tobacco: Never Used  . Tobacco comment: 1/2 pack daily  Substance Use Topics  . Alcohol use: No  . Drug use: No     Allergies   Other; Gabapentin; Naproxen; and Penicillins   Review of Systems Review of Systems  All other systems reviewed and are negative.    Physical Exam Updated Vital Signs BP 136/77   Pulse (!) 138   Ht 5\' 6"  (1.676 m)   Wt 86.2 kg (190 lb)   LMP 11/02/2011   SpO2 96%   BMI 30.67 kg/m   Vital signs normal except for tachycardia   Physical Exam  Constitutional: She is oriented to person, place, and time. She appears well-developed and well-nourished. She appears distressed.  HENT:  Head: Normocephalic and atraumatic.  Eyes: Pupils are equal, round, and reactive to light. Conjunctivae and EOM are normal.  Neck: Normal range of motion. Neck supple.  Cardiovascular: Regular rhythm. Tachycardia present.  Pulmonary/Chest: Tachypnea noted. No  respiratory distress.  Pt has scattered late end expir wheezes, seems anxious, no diminished breath sounds.   Musculoskeletal: Normal range of motion. She exhibits no deformity.  Neurological: She is alert and oriented to person, place, and time. No cranial nerve deficit.  Skin: Skin is warm. Capillary refill takes less than 2 seconds. Rash noted.  Patient has diffuse coalescing urticarial lesions on her extremities trunk.  She has diffuse redness of her face and her neck and chest, she has diffuse small urticarial lesions on her face and she has swelling of her ears.  Psychiatric: Her mood appears anxious. Her speech is rapid and/or pressured.  Nursing note and vitals reviewed.        ED Treatments / Results  Labs (all labs ordered are listed, but only abnormal results are displayed) Labs Reviewed - No data to display  EKG None  Radiology No results found.  Procedures Procedures (including critical care time)  Medications Ordered in ED Medications  methylPREDNISolone sodium succinate (SOLU-MEDROL) 125 mg/2 mL injection 125 mg (125 mg Intravenous Given 05/08/18 0034)  diphenhydrAMINE (BENADRYL) injection 50 mg (50 mg Intravenous Given 05/08/18 0033)  famotidine (PEPCID) IVPB 20 mg premix (0 mg Intravenous Stopped 05/08/18 0055)  levalbuterol (XOPENEX) nebulizer solution 1.25 mg (1.25 mg Nebulization Given 05/08/18 0054)  sodium chloride 0.9 % nebulizer solution (3 mLs  Given 05/08/18 0058)  EPINEPHrine (ADRENALIN) 0.3 mg (0.3 mg Intramuscular Given 05/08/18 0100)     Initial Impression / Assessment and Plan / ED Course  I have reviewed the triage vital signs and the nursing notes.  Pertinent labs & imaging results that were available during my care of the patient were  reviewed by me and considered in my medical decision making (see chart for details).     Patient was given IV Solu-Medrol, Pepcid, and Benadryl for her acute allergic reaction.  Due to the severity of her rash  and her feeling that her breathing and swallowing is impaired she was given low-dose epinephrine IM.  She has had URI symptoms with some wheezing prior to this episode.  She has some late end expiratory wheezing now, she was given Xopenex nebulizer treatment.  Her heart rate was 130 which is why albuterol was not used.  I gave her a low dose of epinephrine IM due to her tachycardia.  However I feel if her allergic reaction symptoms are controlled her tachycardia will improve.  Recheck at 1 AM patient's heart rate has improved down to 105, the redness on her neck and face is much improved, the coalescing urticarial lesions are starting to fade.  She states she feels like she still has a lump in her throat however she is feeling better.  Recheck a 1:35 AM patient's rash is much improved.  She still has some swelling of her ears but they are not as intensely red, her rash is almost gone on her forearms.  There is no longer any redness to her face, neck, or chest.  Recheck at 3 AM patient now just has some swelling of her lower earlobes, left worse than the right otherwise her rash is gone.  She now notes that she thought it started with a mosquito bite in her middle back and then itching of her palm.  She now has 2 small red dots on her palm that are consistent with an insect bite.  Final Clinical Impressions(s) / ED Diagnoses   Final diagnoses:  Acute allergic reaction, initial encounter    ED Discharge Orders        Ordered    predniSONE (DELTASONE) 20 MG tablet     05/08/18 0320    famotidine (PEPCID) 20 MG tablet  2 times daily     05/08/18 0320    EPINEPHrine 0.3 mg/0.3 mL IJ SOAJ injection   Once     05/08/18 0321     Plan discharge  Devoria AlbeIva Jannah Guardiola, MD, [facility] PyoFACEP    Dyann Goodspeed, MD 05/08/18 [phone]

## 2018-05-08 NOTE — Discharge Instructions (Addendum)
Stay cool, heat will make the itching and rash worse. Take the medications as prescribed. You can also take claritin or zyrtec once a day. Take benadryl 25-50 mg every 6 hrs as needed for itching or swelling.  Return to the ED if you get worse again.
